# Patient Record
Sex: Female | Born: 1985 | Race: Black or African American | Hispanic: No | Marital: Single | Smoking: Never smoker
Health system: Southern US, Community
[De-identification: ages and names within clinical notes are randomized; demographics above are authoritative.]

## PROBLEM LIST (undated history)

## (undated) ENCOUNTER — Inpatient Hospital Stay (HOSPITAL_COMMUNITY): Payer: Self-pay

## (undated) DIAGNOSIS — E669 Obesity, unspecified: Secondary | ICD-10-CM

## (undated) DIAGNOSIS — IMO0002 Reserved for concepts with insufficient information to code with codable children: Secondary | ICD-10-CM

## (undated) DIAGNOSIS — O0930 Supervision of pregnancy with insufficient antenatal care, unspecified trimester: Secondary | ICD-10-CM

## (undated) DIAGNOSIS — D649 Anemia, unspecified: Secondary | ICD-10-CM

## (undated) DIAGNOSIS — R87629 Unspecified abnormal cytological findings in specimens from vagina: Secondary | ICD-10-CM

## (undated) HISTORY — DX: Anemia, unspecified: D64.9

## (undated) HISTORY — DX: Obesity, unspecified: E66.9

## (undated) HISTORY — DX: Reserved for concepts with insufficient information to code with codable children: IMO0002

## (undated) HISTORY — DX: Unspecified abnormal cytological findings in specimens from vagina: R87.629

## (undated) HISTORY — PX: NO PAST SURGERIES: SHX2092

## (undated) HISTORY — DX: Supervision of pregnancy with insufficient antenatal care, unspecified trimester: O09.30

---

## 2011-05-18 ENCOUNTER — Encounter: Payer: Self-pay | Admitting: Obstetrics and Gynecology

## 2011-10-08 ENCOUNTER — Emergency Department (HOSPITAL_COMMUNITY)
Admission: EM | Admit: 2011-10-08 | Discharge: 2011-10-08 | Disposition: A | Discharge status: Home / Self Care | Payer: Self-pay | Attending: Emergency Medicine | Admitting: Emergency Medicine

## 2011-10-08 ENCOUNTER — Emergency Department (HOSPITAL_COMMUNITY): Payer: Self-pay

## 2011-10-08 ENCOUNTER — Encounter (HOSPITAL_COMMUNITY): Payer: Self-pay | Admitting: *Deleted

## 2011-10-08 DIAGNOSIS — R1032 Left lower quadrant pain: Secondary | ICD-10-CM | POA: Insufficient documentation

## 2011-10-08 DIAGNOSIS — Z3201 Encounter for pregnancy test, result positive: Secondary | ICD-10-CM | POA: Insufficient documentation

## 2011-10-08 LAB — ABO/RH: ABO/RH(D): O POS

## 2011-10-08 LAB — URINALYSIS, ROUTINE W REFLEX MICROSCOPIC
Glucose, UA: NEGATIVE mg/dL
Leukocytes, UA: NEGATIVE
Protein, ur: NEGATIVE mg/dL
Specific Gravity, Urine: 1.024 (ref 1.005–1.030)
pH: 5.5 (ref 5.0–8.0)

## 2011-10-08 LAB — POCT PREGNANCY, URINE: Preg Test, Ur: POSITIVE — AB

## 2011-10-08 LAB — CBC
MCH: 24.1 pg — ABNORMAL LOW (ref 26.0–34.0)
Platelets: 247 10*3/uL (ref 150–400)
RBC: 4.65 MIL/uL (ref 3.87–5.11)

## 2011-10-08 LAB — WET PREP, GENITAL

## 2011-10-08 MED ORDER — OXYCODONE-ACETAMINOPHEN 5-325 MG PO TABS
1.0000 | ORAL_TABLET | ORAL | Status: AC
  Administered 2011-10-08: 1 via ORAL
  Filled 2011-10-08: qty 1

## 2011-10-08 MED ORDER — ACETAMINOPHEN 325 MG PO TABS
650.0000 mg | ORAL_TABLET | Freq: Once | ORAL | Status: AC
  Administered 2011-10-08: 650 mg via ORAL
  Filled 2011-10-08: qty 2

## 2011-10-08 NOTE — ED Provider Notes (Signed)
History     CSN: [id number]  Arrival date & time 10/08/11  1739   None     Chief Complaint  Patient presents with  . Abdominal Pain    (Consider location/radiation/quality/duration/timing/severity/associated sxs/prior treatment) Patient is a 26 y.o. female presenting with abdominal pain. The history is provided by the patient.  Abdominal Pain The primary symptoms of the illness include abdominal pain. The primary symptoms of the illness do not include fever, fatigue, shortness of breath, nausea, vomiting, diarrhea or dysuria. The current episode started yesterday (11pm). The onset of the illness was sudden. The problem has not changed since onset. The abdominal pain began yesterday. The pain came on suddenly. The abdominal pain has been unchanged since its onset. The abdominal pain is located in the LLQ. The abdominal pain does not radiate. The severity of the abdominal pain is 7/10. The abdominal pain is relieved by nothing. Exacerbated by: palpation.  Associated with: possibly assoc w/ preg. The patient states that she believes she is currently pregnant. The patient has not had a change in bowel habit. Symptoms associated with the illness do not include hematuria or back pain.    History reviewed. No pertinent past medical history.  History reviewed. No pertinent past surgical history.  No family history on file.  History  Substance Use Topics  . Smoking status: Never Smoker   . Smokeless tobacco: Not on file  . Alcohol Use: No    OB History    Grav Para Term Preterm Abortions TAB SAB Ect Mult Living                  Review of Systems  Constitutional: Negative for fever and fatigue.  HENT: Negative for congestion, drooling and neck pain.   Eyes: Negative for pain.  Respiratory: Negative for cough and shortness of breath.   Cardiovascular: Negative for chest pain.  Gastrointestinal: Positive for abdominal pain. Negative for nausea, vomiting and diarrhea.    Genitourinary: Negative for dysuria and hematuria.  Musculoskeletal: Negative for back pain and gait problem.  Skin: Negative for color change.  Neurological: Negative for dizziness and headaches.  Hematological: Negative for adenopathy.  Psychiatric/Behavioral: Negative for behavioral problems.  All other systems reviewed and are negative.    Allergies  Review of patient's allergies indicates no known allergies.  Home Medications  No current outpatient prescriptions on file.  BP 127/74  Pulse 69  Temp 98.4 F (36.9 C) (Oral)  Resp 16  Ht 5\' 9"  (1.753 m)  Wt 215 lb (97.523 kg)  BMI 31.75 kg/m2  SpO2 100%  LMP 08/30/2011  Physical Exam  Nursing note and vitals reviewed. Constitutional: She is oriented to person, place, and time. She appears well-developed and well-nourished.  HENT:  Head: Normocephalic.  Mouth/Throat: No oropharyngeal exudate.  Eyes: Conjunctivae normal and EOM are normal. Pupils are equal, round, and reactive to light.  Neck: Normal range of motion. Neck supple.  Cardiovascular: Normal rate, regular rhythm, normal heart sounds and intact distal pulses.  Exam reveals no gallop and no friction rub.   No murmur heard. Pulmonary/Chest: Effort normal and breath sounds normal. No respiratory distress. She has no wheezes.  Abdominal: Soft. Bowel sounds are normal. There is tenderness (mild ttp of LLQ). There is no rebound and no guarding.  Genitourinary: Vagina normal. No vaginal discharge found.       Normal appearing external vagina. Normal appearing cervix, os closed, small amount of white fluid in posterior fornix. No CMT or ttp on  bimanual.   Musculoskeletal: Normal range of motion. She exhibits no edema and no tenderness.  Neurological: She is alert and oriented to person, place, and time.  Skin: Skin is warm and dry.  Psychiatric: She has a normal mood and affect. Her behavior is normal.    ED Course  Procedures (including critical care time)  Labs  Reviewed  POCT PREGNANCY, URINE - Abnormal; Notable for the following:    Preg Test, Ur POSITIVE (*)     All other components within normal limits  HCG, QUANTITATIVE, PREGNANCY - Abnormal; Notable for the following:    hCG, Beta Chain, Quant, S 2248 (*)     All other components within normal limits  CBC - Abnormal; Notable for the following:    WBC 14.1 (*)     Hemoglobin 11.2 (*)     HCT 34.5 (*)     MCV 74.2 (*)     MCH 24.1 (*)     All other components within normal limits  WET PREP, GENITAL - Abnormal; Notable for the following:    WBC, Wet Prep HPF POC FEW (*)     All other components within normal limits  URINALYSIS, ROUTINE W REFLEX MICROSCOPIC  ABO/RH  GC/CHLAMYDIA PROBE AMP, GENITAL  GC/CHLAMYDIA PROBE AMP, URINE  GC/CHLAMYDIA PROBE AMP, GENITAL   US Ob Comp Less 14 Wks  10/08/2011  *RADIOLOGY REPORT*  Clinical Data: 26 year old female with left lower quadrant pain and positive pregnancy test.  Quantitative beta HCG not specified. Gestational age by LMP 5 weeks and 5 days.  OBSTETRIC <14 WK [place] AND TRANSVAGINAL OB US  Technique:  Both transabdominal and transvaginal ultrasound examinations were performed for complete evaluation of the gestation as well as the maternal uterus, adnexal regions, and pelvic cul-de-sac.  Transvaginal technique was performed to assess early pregnancy.  Comparison:  None.  Intrauterine gestational sac:  Single probable early gestational sac in the fundus Yolk sac: None Embryo: None Cardiac Activity: None  MSD: 4.7 mm  5 w 2 d [place] EDC: 06/07/2012  Maternal uterus/adnexae: Trace simple appearing free fluid in the cul-de-sac.  No subchorionic hemorrhage.  Normal right ovary measuring 3.2 x 2.4 x 3.0 cm.  Normal left ovary measuring 2.8 x 1.8 x 1.9 cm.  Nabothian cyst in the cervix.  IMPRESSION: Probable early gestational sac in the uterus.  No yolk sac or embryo visible.  No adnexal mass.  Trace simple appearing free fluid. Recommend correlation with serial  quantitative BHCG and followup imaging.   Original Report Authenticated By: Harley Hallmark, M.D.    US Ob Transvaginal  10/08/2011  *RADIOLOGY REPORT*  Clinical Data: 26 year old female with left lower quadrant pain and positive pregnancy test.  Quantitative beta HCG not specified. Gestational age by LMP 5 weeks and 5 days.  OBSTETRIC <14 WK [place] AND TRANSVAGINAL OB US  Technique:  Both transabdominal and transvaginal ultrasound examinations were performed for complete evaluation of the gestation as well as the maternal uterus, adnexal regions, and pelvic cul-de-sac.  Transvaginal technique was performed to assess early pregnancy.  Comparison:  None.  Intrauterine gestational sac:  Single probable early gestational sac in the fundus Yolk sac: None Embryo: None Cardiac Activity: None  MSD: 4.7 mm  5 w 2 d [place] EDC: 06/07/2012  Maternal uterus/adnexae: Trace simple appearing free fluid in the cul-de-sac.  No subchorionic hemorrhage.  Normal right ovary measuring 3.2 x 2.4 x 3.0 cm.  Normal left ovary measuring 2.8 x 1.8 x 1.9 cm.  Nabothian cyst in the cervix.  IMPRESSION: Probable early gestational sac in the uterus.  No yolk sac or embryo visible.  No adnexal mass.  Trace simple appearing free fluid. Recommend correlation with serial quantitative BHCG and followup imaging.   Original Report Authenticated By: Harley Hallmark, M.D.      1. LLQ abdominal pain       MDM  2:25 PM 26 y.o. female G58P4 s/p 2 elective abortions and 2 living children pw LLQ pain that started yesterday evening. Pt believes LMP to be 13th of Aug '13. She did have spotting on the 13th of this month and has intermittent cramps in lower abd for last week w/ sudden onset of LLQ sharp pain last night that has persisted. Pt AFVSS here, LLQ pain on exam, abd soft and benign. Upreg pos. Will perform pelvic, labs, [place]. Tylenol for pain.     Intrauterine gestational sac noted on [place] and is reassuring. Spoke w/ Dr. Marcelle Overlie who is on call for Dr.  [facility]. Will have her f/u in 48 hrs. Pt notified to call for appt tomorrow.  I have discussed the diagnosis/risks/treatment options with the patient and believe the pt to be eligible for discharge home to follow-up with Dr. Chevis Pretty in 2 days, call for appt tomorrow. We also discussed returning to the ED immediately if new or worsening sx occur. We discussed the sx which are most concerning (e.g., worsening pain, vaginal bleeding) that necessitate immediate return. Any new prescriptions provided to the patient are listed below.  New Prescriptions   No medications on file   Clinical Impression 1. LLQ abdominal pain             Purvis Sheffield, MD 10/09/11 1425

## 2011-10-08 NOTE — ED Notes (Signed)
The patient is AOx4 and comfortable with her discharge instructions. 

## 2011-10-08 NOTE — ED Notes (Signed)
To ED for eval of LLQ pain and spotting since last night. Pt is [redacted] wks pregnant.

## 2011-10-09 LAB — GC/CHLAMYDIA PROBE AMP, GENITAL: GC Probe Amp, Genital: NEGATIVE

## 2011-10-09 NOTE — ED Provider Notes (Signed)
I saw and evaluated the patient, reviewed the resident's note and I agree with the findings and plan. lmp approx. 1 mo ago. C/o spotting and left sided abd pain. No uti sxs. No n/v/f/c.  No light headedness or sob.  pe mild llq ttp. No peritoneal signs.  gu exam by resident.   Quant > 2000.  [place] + gest sac.  Will have pt f/u with her ob for repeat quant and monitoring.  Cheri Guppy, MD 10/09/11 [phone]

## 2011-10-11 LAB — GC/CHLAMYDIA PROBE AMP, URINE: Chlamydia, Swab/Urine, PCR: NEGATIVE

## 2011-10-14 NOTE — ED Provider Notes (Signed)
I saw and evaluated the patient, reviewed the resident's note and I agree with the findings and plan.  Taiden Raybourn, MD 10/14/11 0715 

## 2012-01-17 NOTE — L&D Delivery Note (Signed)
Delivery Note At 2:13 PM a viable female was delivered via  (Presentation: ; Occiput Anterior).  APGAR: 9, 9; weight pending.   Placenta status: intact, spontaneous.  Cord: 3 vessels with the following complications: None.  Cord pH: n/a  Anesthesia:  Epidural Episiotomy: none Lacerations: minor right labial Suture Repair: n/a Est. Blood Loss (mL): 250 ml  Mom to postpartum.  Baby to nursery-stable.  Napoleon Form 06/11/2012, 2:30 PM

## 2012-04-24 ENCOUNTER — Other Ambulatory Visit (HOSPITAL_COMMUNITY): Payer: Self-pay | Admitting: Physician Assistant

## 2012-04-24 DIAGNOSIS — Z3689 Encounter for other specified antenatal screening: Secondary | ICD-10-CM

## 2012-04-24 LAB — OB RESULTS CONSOLE ANTIBODY SCREEN: Antibody Screen: NEGATIVE

## 2012-04-24 LAB — OB RESULTS CONSOLE HIV ANTIBODY (ROUTINE TESTING): HIV: NONREACTIVE

## 2012-04-24 LAB — OB RESULTS CONSOLE GC/CHLAMYDIA: Gonorrhea: NEGATIVE

## 2012-04-24 LAB — OB RESULTS CONSOLE ABO/RH

## 2012-04-29 ENCOUNTER — Ambulatory Visit (HOSPITAL_COMMUNITY)
Admission: RE | Admit: 2012-04-29 | Discharge: 2012-04-29 | Disposition: A | Discharge status: Home / Self Care | Payer: Medicaid Other | Source: Ambulatory Visit | Attending: Physician Assistant | Admitting: Physician Assistant

## 2012-04-29 DIAGNOSIS — Z363 Encounter for antenatal screening for malformations: Secondary | ICD-10-CM | POA: Insufficient documentation

## 2012-04-29 DIAGNOSIS — Z1389 Encounter for screening for other disorder: Secondary | ICD-10-CM | POA: Insufficient documentation

## 2012-04-29 DIAGNOSIS — O0930 Supervision of pregnancy with insufficient antenatal care, unspecified trimester: Secondary | ICD-10-CM | POA: Insufficient documentation

## 2012-04-29 DIAGNOSIS — Z3689 Encounter for other specified antenatal screening: Secondary | ICD-10-CM

## 2012-04-29 DIAGNOSIS — O358XX0 Maternal care for other (suspected) fetal abnormality and damage, not applicable or unspecified: Secondary | ICD-10-CM | POA: Insufficient documentation

## 2012-05-08 LAB — OB RESULTS CONSOLE GBS: GBS: NEGATIVE

## 2012-06-04 ENCOUNTER — Encounter (HOSPITAL_COMMUNITY): Payer: Self-pay | Admitting: *Deleted

## 2012-06-04 ENCOUNTER — Telehealth (HOSPITAL_COMMUNITY): Payer: Self-pay | Admitting: *Deleted

## 2012-06-04 NOTE — Telephone Encounter (Signed)
Preadmission screen  

## 2012-06-07 ENCOUNTER — Ambulatory Visit (INDEPENDENT_AMBULATORY_CARE_PROVIDER_SITE_OTHER): Payer: Medicaid Other | Admitting: *Deleted

## 2012-06-07 VITALS — BP 119/78 | Wt 272.1 lb

## 2012-06-07 DIAGNOSIS — O480 Post-term pregnancy: Secondary | ICD-10-CM

## 2012-06-07 NOTE — Progress Notes (Signed)
P = 86   Results reported to Dr. Jolayne Panther and she reviewed tracing prior to pt discharge.  Pt is scheduled for IOL on 5/27.

## 2012-06-07 NOTE — Progress Notes (Signed)
NST reviewed and reactive. AFI 14 and BPP 8/8. Patient scheduled for IOL tuesday

## 2012-06-11 ENCOUNTER — Encounter (HOSPITAL_COMMUNITY): Payer: Self-pay

## 2012-06-11 ENCOUNTER — Encounter (HOSPITAL_COMMUNITY): Payer: Self-pay | Admitting: Anesthesiology

## 2012-06-11 ENCOUNTER — Inpatient Hospital Stay (HOSPITAL_COMMUNITY): Payer: Medicaid Other | Admitting: Anesthesiology

## 2012-06-11 ENCOUNTER — Inpatient Hospital Stay (HOSPITAL_COMMUNITY)
Admission: RE | Admit: 2012-06-11 | Discharge: 2012-06-12 | DRG: 775 | Disposition: A | Discharge status: Home / Self Care | Payer: Medicaid Other | Source: Ambulatory Visit | Attending: Family Medicine | Admitting: Family Medicine

## 2012-06-11 DIAGNOSIS — O480 Post-term pregnancy: Principal | ICD-10-CM | POA: Diagnosis present

## 2012-06-11 LAB — TYPE AND SCREEN
ABO/RH(D): O POS
Antibody Screen: NEGATIVE

## 2012-06-11 LAB — CBC
HCT: 29.7 % — ABNORMAL LOW (ref 36.0–46.0)
MCHC: 31 g/dL (ref 30.0–36.0)
Platelets: 223 10*3/uL (ref 150–400)
RDW: 16.1 % — ABNORMAL HIGH (ref 11.5–15.5)
WBC: 14.4 10*3/uL — ABNORMAL HIGH (ref 4.0–10.5)

## 2012-06-11 MED ORDER — WITCH HAZEL-GLYCERIN EX PADS: 1.0000 "application " | MEDICATED_PAD | CUTANEOUS | Status: DC | PRN

## 2012-06-11 MED ORDER — ONDANSETRON HCL 4 MG PO TABS: 4.0000 mg | ORAL_TABLET | ORAL | Status: DC | PRN

## 2012-06-11 MED ORDER — OXYTOCIN BOLUS FROM INFUSION: 500.0000 mL | INTRAVENOUS | Status: DC

## 2012-06-11 MED ORDER — OXYCODONE-ACETAMINOPHEN 5-325 MG PO TABS: 1.0000 | ORAL_TABLET | ORAL | Status: DC | PRN

## 2012-06-11 MED ORDER — SENNOSIDES-DOCUSATE SODIUM 8.6-50 MG PO TABS
2.0000 | ORAL_TABLET | Freq: Every day | ORAL | Status: DC
  Administered 2012-06-11: 2 via ORAL

## 2012-06-11 MED ORDER — EPHEDRINE 5 MG/ML INJ
10.0000 mg | INTRAVENOUS | Status: DC | PRN
  Filled 2012-06-11: qty 2

## 2012-06-11 MED ORDER — FENTANYL 2.5 MCG/ML BUPIVACAINE 1/10 % EPIDURAL INFUSION (WH - ANES)
14.0000 mL/h | INTRAMUSCULAR | Status: DC | PRN
  Administered 2012-06-11: 14 mL/h via EPIDURAL
  Filled 2012-06-11: qty 125

## 2012-06-11 MED ORDER — TERBUTALINE SULFATE 1 MG/ML IJ SOLN: 0.2500 mg | Freq: Once | INTRAMUSCULAR | Status: DC | PRN

## 2012-06-11 MED ORDER — SIMETHICONE 80 MG PO CHEW: 80.0000 mg | CHEWABLE_TABLET | ORAL | Status: DC | PRN

## 2012-06-11 MED ORDER — BISACODYL 10 MG RE SUPP: 10.0000 mg | Freq: Every day | RECTAL | Status: DC | PRN

## 2012-06-11 MED ORDER — PRENATAL MULTIVITAMIN CH
1.0000 | ORAL_TABLET | Freq: Every day | ORAL | Status: DC
  Administered 2012-06-12: 1 via ORAL
  Filled 2012-06-11: qty 1

## 2012-06-11 MED ORDER — LACTATED RINGERS IV SOLN
500.0000 mL | Freq: Once | INTRAVENOUS | Status: AC
  Administered 2012-06-11: 500 mL via INTRAVENOUS

## 2012-06-11 MED ORDER — EPHEDRINE 5 MG/ML INJ
10.0000 mg | INTRAVENOUS | Status: DC | PRN
  Filled 2012-06-11: qty 2
  Filled 2012-06-11: qty 4

## 2012-06-11 MED ORDER — IBUPROFEN 600 MG PO TABS
600.0000 mg | ORAL_TABLET | Freq: Four times a day (QID) | ORAL | Status: DC | PRN
  Filled 2012-06-11: qty 1

## 2012-06-11 MED ORDER — LIDOCAINE HCL (PF) 1 % IJ SOLN
INTRAMUSCULAR | Status: DC | PRN
  Administered 2012-06-11 (×4): 4 mL

## 2012-06-11 MED ORDER — TETANUS-DIPHTH-ACELL PERTUSSIS 5-2.5-18.5 LF-MCG/0.5 IM SUSP
0.5000 mL | Freq: Once | INTRAMUSCULAR | Status: AC
  Administered 2012-06-12: 0.5 mL via INTRAMUSCULAR
  Filled 2012-06-11: qty 0.5

## 2012-06-11 MED ORDER — NALBUPHINE SYRINGE 5 MG/0.5 ML
10.0000 mg | INJECTION | INTRAMUSCULAR | Status: DC | PRN
  Filled 2012-06-11: qty 1

## 2012-06-11 MED ORDER — LACTATED RINGERS IV SOLN
INTRAVENOUS | Status: DC
  Administered 2012-06-11 (×2): via INTRAVENOUS

## 2012-06-11 MED ORDER — DIPHENHYDRAMINE HCL 25 MG PO CAPS: 25.0000 mg | ORAL_CAPSULE | Freq: Four times a day (QID) | ORAL | Status: DC | PRN

## 2012-06-11 MED ORDER — PHENYLEPHRINE 40 MCG/ML (10ML) SYRINGE FOR IV PUSH (FOR BLOOD PRESSURE SUPPORT)
80.0000 ug | PREFILLED_SYRINGE | INTRAVENOUS | Status: DC | PRN
  Filled 2012-06-11: qty 2

## 2012-06-11 MED ORDER — OXYTOCIN 40 UNITS IN LACTATED RINGERS INFUSION - SIMPLE MED: 62.5000 mL/h | INTRAVENOUS | Status: DC

## 2012-06-11 MED ORDER — FLEET ENEMA 7-19 GM/118ML RE ENEM: 1.0000 | ENEMA | Freq: Every day | RECTAL | Status: DC | PRN

## 2012-06-11 MED ORDER — LANOLIN HYDROUS EX OINT: TOPICAL_OINTMENT | CUTANEOUS | Status: DC | PRN

## 2012-06-11 MED ORDER — LIDOCAINE HCL (PF) 1 % IJ SOLN
30.0000 mL | INTRAMUSCULAR | Status: DC | PRN
  Filled 2012-06-11: qty 30

## 2012-06-11 MED ORDER — OXYTOCIN 40 UNITS IN LACTATED RINGERS INFUSION - SIMPLE MED: 62.5000 mL/h | INTRAVENOUS | Status: DC | PRN

## 2012-06-11 MED ORDER — BENZOCAINE-MENTHOL 20-0.5 % EX AERO
1.0000 "application " | INHALATION_SPRAY | CUTANEOUS | Status: DC | PRN
  Administered 2012-06-11: 1 via TOPICAL
  Filled 2012-06-11: qty 56

## 2012-06-11 MED ORDER — ZOLPIDEM TARTRATE 5 MG PO TABS: 5.0000 mg | ORAL_TABLET | Freq: Every evening | ORAL | Status: DC | PRN

## 2012-06-11 MED ORDER — FERROUS SULFATE 325 (65 FE) MG PO TABS
325.0000 mg | ORAL_TABLET | Freq: Two times a day (BID) | ORAL | Status: DC
  Administered 2012-06-11 – 2012-06-12 (×2): 325 mg via ORAL
  Filled 2012-06-11 (×2): qty 1

## 2012-06-11 MED ORDER — ONDANSETRON HCL 4 MG/2ML IJ SOLN: 4.0000 mg | Freq: Four times a day (QID) | INTRAMUSCULAR | Status: DC | PRN

## 2012-06-11 MED ORDER — DIBUCAINE 1 % RE OINT: 1.0000 "application " | TOPICAL_OINTMENT | RECTAL | Status: DC | PRN

## 2012-06-11 MED ORDER — OXYTOCIN 40 UNITS IN LACTATED RINGERS INFUSION - SIMPLE MED
1.0000 m[IU]/min | INTRAVENOUS | Status: DC
  Administered 2012-06-11: 2 m[IU]/min via INTRAVENOUS
  Filled 2012-06-11: qty 1000

## 2012-06-11 MED ORDER — PHENYLEPHRINE 40 MCG/ML (10ML) SYRINGE FOR IV PUSH (FOR BLOOD PRESSURE SUPPORT)
80.0000 ug | PREFILLED_SYRINGE | INTRAVENOUS | Status: DC | PRN
  Filled 2012-06-11: qty 5
  Filled 2012-06-11: qty 2

## 2012-06-11 MED ORDER — IBUPROFEN 600 MG PO TABS
600.0000 mg | ORAL_TABLET | Freq: Four times a day (QID) | ORAL | Status: DC
  Administered 2012-06-11 – 2012-06-12 (×4): 600 mg via ORAL
  Filled 2012-06-11 (×3): qty 1

## 2012-06-11 MED ORDER — ACETAMINOPHEN 325 MG PO TABS: 650.0000 mg | ORAL_TABLET | ORAL | Status: DC | PRN

## 2012-06-11 MED ORDER — ONDANSETRON HCL 4 MG/2ML IJ SOLN: 4.0000 mg | INTRAMUSCULAR | Status: DC | PRN

## 2012-06-11 MED ORDER — DIPHENHYDRAMINE HCL 50 MG/ML IJ SOLN: 12.5000 mg | INTRAMUSCULAR | Status: DC | PRN

## 2012-06-11 MED ORDER — CITRIC ACID-SODIUM CITRATE 334-500 MG/5ML PO SOLN: 30.0000 mL | ORAL | Status: DC | PRN

## 2012-06-11 MED ORDER — LACTATED RINGERS IV SOLN: 500.0000 mL | INTRAVENOUS | Status: DC | PRN

## 2012-06-11 MED ORDER — MEASLES, MUMPS & RUBELLA VAC ~~LOC~~ INJ
0.5000 mL | INJECTION | Freq: Once | SUBCUTANEOUS | Status: AC
  Administered 2012-06-12: 0.5 mL via SUBCUTANEOUS
  Filled 2012-06-11: qty 0.5

## 2012-06-11 NOTE — H&P (Signed)
Morgan Schwartz is a 27 y.o. female [phone] [redacted]w[redacted]d presenting for IOL for post-dates.  She [facility] 1-week ago.  She has had no complications.  Patient states she has been having regular contractions with no vaginal bleeding.  Membranes are intact with active fetal movement.   She has had 2 vaginal deliveries without complications and 2 TABs.  Maternal Medical History:  Reason for admission: Contractions.  Nausea.  Contractions: Onset was more than 2 days ago.   Frequency: irregular.   Duration is approximately 30 seconds.   Perceived severity is mild.    Fetal activity: Perceived fetal activity is normal.   Last perceived fetal movement was within the past hour.      OB History   Grav Para Term Preterm Abortions TAB SAB Ect Mult Living   6 2 2  2 2    2      Past Medical History  Diagnosis Date  . Abnormal Pap smear   . Anemia   . Obesity   . Late prenatal care    Past Surgical History  Procedure Laterality Date  . No past surgeries     Family History: family history is not on file. Social History:  reports that she has never smoked. She has never used smokeless tobacco. She reports that she does not drink alcohol or use illicit drugs.   Prenatal Transfer Tool  Maternal Diabetes: No Genetic Screening: Declined Maternal Ultrasounds/Referrals: Normal Fetal Ultrasounds or other Referrals:  None Maternal Substance Abuse:  No Significant Maternal Medications:  None Significant Maternal Lab Results:  None Other Comments:  None  Review of Systems  Constitutional: Negative for fever and chills.  HENT: Negative for ear pain and tinnitus.   Eyes: Negative for blurred vision, double vision and photophobia.  Respiratory: Negative for cough, hemoptysis and sputum production.   Cardiovascular: Negative for chest pain, palpitations and claudication.  Gastrointestinal: Positive for abdominal pain. Negative  for heartburn, nausea, vomiting, diarrhea and constipation.       Cramping/contractions  Genitourinary: Negative for dysuria, hematuria and flank pain.  Neurological: Negative for dizziness, weakness and headaches.  Endo/Heme/Allergies: Does not bruise/bleed easily.      Blood pressure 133/88, pulse 88, temperature 98.2 F (36.8 C), temperature source Oral, resp. rate 20, height 5\' 8"  (1.727 m), weight 123.378 kg (272 lb), last menstrual period 08/30/2011, unknown if currently breastfeeding. Maternal Exam:  Uterine Assessment: Contraction strength is firm.  Contraction frequency is irregular.   Abdomen: Fetal presentation: vertex  Introitus: Normal vulva. Normal vagina.  Pelvis: adequate for delivery.   Cervix: Cervix evaluated by digital exam.     Fetal Exam Fetal Monitor Review: Mode: ultrasound.    Fetal State Assessment: Category I - tracings are normal.     Physical Exam  Constitutional: She is oriented to person, place, and time. She appears well-developed and well-nourished. No distress.  HENT:  Head: Normocephalic.  Eyes: Pupils are equal, round, and reactive to light.  Cardiovascular: Normal rate and regular rhythm.  Exam reveals no gallop and no friction rub.   No murmur heard. Respiratory: Effort normal and breath sounds normal. No respiratory distress. She has no wheezes. She has no rales.  GI: Soft. Bowel sounds are normal. There is no tenderness.  Gravid  Genitourinary:  3-4/50%/-2  Neurological: She is alert and oriented to person, place, and time.    Prenatal labs: ABO, Rh: O/Positive/-- (04/09 0000) Antibody: Negative (  04/09 0000) Rubella: Immune (04/09 0000) RPR: Nonreactive (04/09 0000)  HBsAg: Negative (04/09 0000)  HIV: Non-reactive (04/09 0000)  GBS: Negative (04/23 0000)   Assessment/Plan: 1) Admit to L&D 2) GBS neg, no prophylaxis 3) Epidural when desired 4) Augment with Pitocin if needed 5) Anticipate SVD, fetal heart tones  reactive   Anna Genre 06/11/2012, 9:05 AM  I saw and examined patient and agree with above student note. I reviewed history, imaging, labs, and vitals. I personally reviewed the fetal heart tracing, and it is reactive. Napoleon Form, MD

## 2012-06-11 NOTE — Anesthesia Procedure Notes (Signed)
Epidural Patient location during procedure: OB Start time: 06/11/2012 1:20 PM  Staffing Performed by: anesthesiologist   Preanesthetic Checklist Completed: patient identified, site marked, surgical consent, pre-op evaluation, timeout performed, IV checked, risks and benefits discussed and monitors and equipment checked  Epidural Patient position: sitting Prep: site prepped and draped and DuraPrep Patient monitoring: continuous pulse ox and blood pressure Approach: midline Injection technique: LOR air  Needle:  Needle type: Tuohy  Needle gauge: 17 G Needle length: 9 cm and 9 Needle insertion depth: 7.5 cm Catheter type: closed end flexible Catheter size: 19 Gauge Catheter at skin depth: 12.5 cm Test dose: negative  Assessment Events: blood not aspirated, injection not painful, no injection resistance, negative IV test and no paresthesia  Additional Notes Discussed risk of headache, infection, bleeding, nerve injury and failed or incomplete block.  Patient voices understanding and wishes to proceed.  Epidural placed easily on first attempt.  No paresthesia.  Patient tolerated procedure well with no apparent complications.  Jasmine December, MD Reason for block:procedure for pain

## 2012-06-11 NOTE — Anesthesia Preprocedure Evaluation (Signed)
Anesthesia Evaluation  Patient identified by MRN, date of birth, ID band Patient awake    Reviewed: Allergy & Precautions, H&P , NPO status , Patient's Chart, lab work & pertinent test results, reviewed documented beta blocker date and time   History of Anesthesia Complications Negative for: history of anesthetic complications  Airway Mallampati: III TM Distance: >3 FB Neck ROM: full    Dental  (+) Teeth Intact   Pulmonary neg pulmonary ROS,  breath sounds clear to auscultation        Cardiovascular negative cardio ROS  Rhythm:regular Rate:Normal     Neuro/Psych negative neurological ROS  negative psych ROS   GI/Hepatic negative GI ROS, Neg liver ROS,   Endo/Other  Morbid obesity  Renal/GU negative Renal ROS     Musculoskeletal   Abdominal   Peds  Hematology  (+) anemia ,   Anesthesia Other Findings   Reproductive/Obstetrics (+) Pregnancy                          Anesthesia Physical Anesthesia Plan  ASA: III  Anesthesia Plan: Epidural   Post-op Pain Management:    Induction:   Airway Management Planned:   Additional Equipment:   Intra-op Plan:   Post-operative Plan:   Informed Consent: I have reviewed the patients History and Physical, chart, labs and discussed the procedure including the risks, benefits and alternatives for the proposed anesthesia with the patient or authorized representative who has indicated his/her understanding and acceptance.     Plan Discussed with:   Anesthesia Plan Comments:        Anesthesia Quick Evaluation  

## 2012-06-12 MED ORDER — IBUPROFEN 600 MG PO TABS: 600.0000 mg | ORAL_TABLET | Freq: Four times a day (QID) | ORAL | Status: DC

## 2012-06-12 NOTE — Anesthesia Postprocedure Evaluation (Signed)
Anesthesia Post Note  Patient: Management consultant  Procedure(s) Performed: * No procedures listed *  Anesthesia type: Epidural  Patient location: Mother/Baby  Post pain: Pain level controlled  Post assessment: Post-op Vital signs reviewed  Last Vitals:  Filed Vitals:   06/12/12 0506  BP: 118/81  Pulse: 68  Temp: 36.9 C  Resp: 17    Post vital signs: Reviewed  Level of consciousness:alert  Complications: No apparent anesthesia complications

## 2012-06-12 NOTE — Progress Notes (Signed)
Ur chart review completed.  

## 2012-06-12 NOTE — Discharge Summary (Signed)
Attestation of Attending Supervision of Advanced Practitioner (CNM/NP): Evaluation and management procedures were performed by the Advanced Practitioner under my supervision and collaboration.  I have reviewed the Advanced Practitioner's note and chart, and I agree with the management and plan.  HARRAWAY-SMITH, Dragan Tamburrino 11:50 AM

## 2012-06-12 NOTE — Discharge Summary (Signed)
Obstetric Discharge Summary Reason for Admission: induction of labor Prenatal Procedures: none Intrapartum Procedures: spontaneous vaginal delivery Postpartum Procedures: none Complications-Operative and Postpartum: none  Morgan Schwartz is a 27 y.o. female [phone] who had a SVD yesterday at 1400.  She originally presented to the [facility] for for IOL for post-dates.  She states that she has been voiding and passing flatus without complication, though she has yet to have a bowel movement. She states her pain is controlled with ibuprofen and she has no dizziness upon standing. She will breast feed and plans to [facility] an [facility] to [facility]. She is not planning a circumcision for her son.     Hemoglobin  Date Value Range Status  06/11/2012 9.2* 12.0 - 15.0 g/dL Final     HCT  Date Value Range Status  06/11/2012 29.7* 36.0 - 46.0 % Final    Physical Exam:  General: alert, cooperative and no distress CVS: RRR, good S1 and S2, no RGM  Lungs: CTA bilaterally, no wheeze, rales, or rhonchi  Abd: Normoactive BS x4, non-distended; no tenderness on palpation Lochia: appropriate Uterine Fundus: firm DVT Evaluation: No evidence of DVT seen on physical exam.  Negative Homan's sign.  Discharge Diagnoses: Term Pregnancy-delivered  Discharge Information: Date: 06/12/2012 Activity: pelvic rest Diet: routine Medications: PNV, Ibuprofen, Colace and Iron Condition: stable Instructions: refer to practice specific booklet Discharge to: home   Newborn Data: Live born female  Birth Weight: 7 lb 7 oz (3374 g) APGAR: 9, 9  Home with mother.  Anna Genre 06/12/2012, 8:08 AM  I examined pt and agree with documentation above and PA-S plan of care. [facility]

## 2012-07-04 ENCOUNTER — Encounter: Payer: Self-pay | Admitting: *Deleted

## 2012-07-23 ENCOUNTER — Encounter (HOSPITAL_COMMUNITY): Payer: Self-pay | Admitting: *Deleted

## 2013-03-21 ENCOUNTER — Emergency Department (HOSPITAL_COMMUNITY)
Admission: EM | Admit: 2013-03-21 | Discharge: 2013-03-21 | Disposition: A | Discharge status: Home / Self Care | Payer: Medicaid Other | Attending: Emergency Medicine | Admitting: Emergency Medicine

## 2013-03-21 ENCOUNTER — Encounter (HOSPITAL_COMMUNITY): Payer: Self-pay | Admitting: Emergency Medicine

## 2013-03-21 DIAGNOSIS — R21 Rash and other nonspecific skin eruption: Secondary | ICD-10-CM | POA: Insufficient documentation

## 2013-03-21 DIAGNOSIS — T7840XA Allergy, unspecified, initial encounter: Secondary | ICD-10-CM

## 2013-03-21 DIAGNOSIS — E669 Obesity, unspecified: Secondary | ICD-10-CM | POA: Insufficient documentation

## 2013-03-21 DIAGNOSIS — Z862 Personal history of diseases of the blood and blood-forming organs and certain disorders involving the immune mechanism: Secondary | ICD-10-CM | POA: Insufficient documentation

## 2013-03-21 DIAGNOSIS — T148XXA Other injury of unspecified body region, initial encounter: Secondary | ICD-10-CM

## 2013-03-21 DIAGNOSIS — L089 Local infection of the skin and subcutaneous tissue, unspecified: Secondary | ICD-10-CM

## 2013-03-21 MED ORDER — DOXYCYCLINE HYCLATE 100 MG PO CAPS: 100.0000 mg | ORAL_CAPSULE | Freq: Two times a day (BID) | ORAL | Status: DC

## 2013-03-21 MED ORDER — PREDNISONE 20 MG PO TABS: ORAL_TABLET | ORAL | Status: DC

## 2013-03-21 MED ORDER — DIPHENHYDRAMINE HCL 25 MG PO TABS: 25.0000 mg | ORAL_TABLET | Freq: Four times a day (QID) | ORAL | Status: DC

## 2013-03-21 MED ORDER — FAMOTIDINE 20 MG PO TABS: 20.0000 mg | ORAL_TABLET | Freq: Two times a day (BID) | ORAL | Status: DC

## 2013-03-21 NOTE — Discharge Instructions (Signed)
Read the information below.  Use the prescribed medication as directed.  Please discuss all new medications with your pharmacist.  You may return to the Emergency Department at any time for worsening condition or any new symptoms that concern you.  If there is any possibility that you might be pregnant, please let your health care provider know and discuss this with the pharmacist to ensure medication safety.  If you develop redness, swelling, pus draining from the wound, are unable to move your finger, have red streaks going up your hand or arm, or fevers greater than 100.4, return to the ER immediately for a recheck.  If you develop difficulty swallowing or breathing, or you are unable to tolerate fluids by mouth, return to the ER immediately for a recheck.     Contact Dermatitis Contact dermatitis is a reaction to certain substances that touch the skin. Contact dermatitis can be either irritant contact dermatitis or allergic contact dermatitis. Irritant contact dermatitis does not require previous exposure to the substance for a reaction to occur.Allergic contact dermatitis only occurs if you have been exposed to the substance before. Upon a repeat exposure, your body reacts to the substance.  CAUSES  Many substances can cause contact dermatitis. Irritant dermatitis is most commonly caused by repeated exposure to mildly irritating substances, such as:  Makeup.  Soaps.  Detergents.  Bleaches.  Acids.  Metal salts, such as nickel. Allergic contact dermatitis is most commonly caused by exposure to:  Poisonous plants.  Chemicals (deodorants, shampoos).  Jewelry.  Latex.  Neomycin in triple antibiotic cream.  Preservatives in products, including clothing. SYMPTOMS  The area of skin that is exposed may develop:  Dryness or flaking.  Redness.  Cracks.  Itching.  Pain or a burning sensation.  Blisters. With allergic contact dermatitis, there may also be swelling in areas such  as the eyelids, mouth, or genitals.  DIAGNOSIS  Your caregiver can usually tell what the problem is by doing a physical exam. In cases where the cause is uncertain and an allergic contact dermatitis is suspected, a patch skin test may be performed to help determine the cause of your dermatitis. TREATMENT Treatment includes protecting the skin from further contact with the irritating substance by avoiding that substance if possible. Barrier creams, powders, and gloves may be helpful. Your caregiver may also recommend:  Steroid creams or ointments applied 2 times daily. For best results, soak the rash area in cool water for 20 minutes. Then apply the medicine. Cover the area with a plastic wrap. You can store the steroid cream in the refrigerator for a "chilly" effect on your rash. That may decrease itching. Oral steroid medicines may be needed in more severe cases.  Antibiotics or antibacterial ointments if a skin infection is present.  Antihistamine lotion or an antihistamine taken by mouth to ease itching.  Lubricants to keep moisture in your skin.  Burow's solution to reduce redness and soreness or to dry a weeping rash. Mix one packet or tablet of solution in 2 cups cool water. Dip a clean washcloth in the mixture, wring it out a bit, and put it on the affected area. Leave the cloth in place for 30 minutes. Do this as often as possible throughout the day.  Taking several cornstarch or baking soda baths daily if the area is too large to cover with a washcloth. Harsh chemicals, such as alkalis or acids, can cause skin damage that is like a burn. You should flush your skin for 15  to 20 minutes with cold water after such an exposure. You should also seek immediate medical care after exposure. Bandages (dressings), antibiotics, and pain medicine may be needed for severely irritated skin.  HOME CARE INSTRUCTIONS  Avoid the substance that caused your reaction.  Keep the area of skin that is  affected away from hot water, soap, sunlight, chemicals, acidic substances, or anything else that would irritate your skin.  Do not scratch the rash. Scratching may cause the rash to become infected.  You may take cool baths to help stop the itching.  Only take over-the-counter or prescription medicines as directed by your caregiver.  See your caregiver for follow-up care as directed to make sure your skin is healing properly. SEEK MEDICAL CARE IF:   Your condition is not better after 3 days of treatment.  You seem to be getting worse.  You see signs of infection such as swelling, tenderness, redness, soreness, or warmth in the affected area.  You have any problems related to your medicines. Document Released: 12/31/1999 Document Revised: 03/27/2011 Document Reviewed: 06/07/2010 [facility] Patient Information 2014 [place], [place].

## 2013-03-21 NOTE — ED Notes (Signed)
Pt c/o red scaly rash to R hand for the past 2 weeks. Pt recently dx'ed with eczema. Pt also has fine papular rash "all over". Pt states she is itching all over. Pt has used hydrocortisone cream, but it is not helping much. Pt alert, no acute distress. Breaths even/unlabored.

## 2013-03-21 NOTE — ED Provider Notes (Signed)
CSN: [id number]     Arrival date & time 03/21/13  1631 History  This chart was scribed for non-physician practitioner, Trixie DredgeEmily Tonye Tancredi, PA-C, working with Toy BakerAnthony T Allen, MD by Charline BillsEssence Howell, ED Scribe. This patient was seen in room WTR9/WTR9 and the patient's care was started at 5:05 PM.    Chief Complaint  Patient presents with  . Rash    The history is provided by the patient. No language interpreter was used.   HPI Comments: Morgan Schwartz is a 28 y.o. female who presents to the Emergency Department complaining of a gradually worsening, scaly erythematous rash that started on her right hand 9 months ago. She states that her OB/GYN at the time suggested hydrocortisone cream which she applied, but the rash never full resolved. She states that about 3 weeks ago, the rash became itchy and when she scratched it, she developed sores with drainage at the rash site on her head. She also states that about 3 days ago, she developed hives to her arms, chest, abdomen and neck. She denies SOB, chest tightness, dysphagia, oral swelling, weakness and numbness. She denies any changes to her foods but states that she moved to a new apartment this weekend, with new rugs and a new comforter. She also spent the weekend cleaning the old apartment.      Past Medical History  Diagnosis Date  . Abnormal Pap smear   . Anemia   . Obesity   . Late prenatal care    Past Surgical History  Procedure Laterality Date  . No past surgeries     No family history on file. History  Substance Use Topics  . Smoking status: Never Smoker   . Smokeless tobacco: Never Used  . Alcohol Use: No   OB History   Grav Para Term Preterm Abortions TAB SAB Ect Mult Living   6 3 3  2 2    3      Review of Systems  Constitutional: Negative for fever.  HENT: Negative for trouble swallowing.   Respiratory: Negative for chest tightness and shortness of breath.   Gastrointestinal: Negative for vomiting.  Skin: Positive for rash and  wound. Negative for color change.  Allergic/Immunologic: Negative for immunocompromised state.  Neurological: Negative for weakness and numbness.  Hematological: Does not bruise/bleed easily.    Allergies  Review of patient's allergies indicates no known allergies.  Home Medications   Current Outpatient Rx  Name  Route  Sig  Dispense  Refill  . acetaminophen (TYLENOL) 500 MG tablet   Oral   Take 1,000 mg by mouth every 6 (six) hours as needed for mild pain.         . hydrocortisone cream 1 %   Topical   Apply 1 application topically 2 (two) times daily as needed for itching.          Triage Vitals: BP 135/79  Pulse 60  Temp(Src) 98.3 F (36.8 C) (Oral)  Resp 16  SpO2 100% Physical Exam  Nursing note and vitals reviewed. Constitutional: She appears well-developed and well-nourished. No distress.  HENT:  Head: Normocephalic and atraumatic.  Neck: Neck supple.  Pulmonary/Chest: Effort normal.  Neurological: She is alert.  Skin: She is not diaphoretic.  Left hand: 3rd finger with scaly rash with scabbing, golden dried discharge, thickening of the skin.  Full AROM of the finger, sensation intact, capillary refill < 2 seconds.  Nontender. No induration or fluctuance.    Scattered erythematous papular rash over bilateral upper extremities  and to a lesser extent over upper chest, posterior neck.  No burrows.  No surrounding erythema, edema.  Nontender.  No discharge.      ED Course  Procedures (including critical care time) DIAGNOSTIC STUDIES: Oxygen Saturation is 100% on RA, normal by my interpretation.    COORDINATION OF CARE: 5:11 PM-Discussed treatment plan which includes a course of antibiotics, prednisone, Benadryl, Pepcid and dermatologist referrals. Also discussed return precautions with pt at bedside and pt agreed to plan.   Labs Review Labs Reviewed - No data to display Imaging Review No results found.   EKG Interpretation None      MDM   Final  diagnoses:  Rash  Wound infection  Allergic reaction    Pt with right 3rd finger with chronic skin changes, likely eczema that now has a superinfection.  No e/o underlying abscess.  Pt also with pruritic rash over upper extremities and some on upper chest and posterior neck - this is likely due to one of many environmental and chemical exposures that occurred this week.  Pt d/c home with prednisone, benadryl, pepcid, doxycycline.  Discussed findings, treatment, and follow up  with patient.  Pt given return precautions.  Pt verbalizes understanding and agrees with plan.      I personally performed the services described in this documentation, which was scribed in my presence. The recorded information has been reviewed and is accurate.    Trixie Dredge, PA-C 03/21/13 1858

## 2013-03-22 NOTE — ED Provider Notes (Signed)
Medical screening examination/treatment/procedure(s) were performed by non-physician practitioner and as supervising physician I was immediately available for consultation/collaboration.   Toy BakerAnthony T Jalacia Mattila, MD 03/22/13 [phone]

## 2013-07-17 ENCOUNTER — Emergency Department (HOSPITAL_COMMUNITY)
Admission: EM | Admit: 2013-07-17 | Discharge: 2013-07-17 | Disposition: A | Discharge status: Home / Self Care | Payer: Medicaid Other | Attending: Emergency Medicine | Admitting: Emergency Medicine

## 2013-07-17 ENCOUNTER — Encounter (HOSPITAL_COMMUNITY): Payer: Self-pay | Admitting: Emergency Medicine

## 2013-07-17 DIAGNOSIS — Y92410 Unspecified street and highway as the place of occurrence of the external cause: Secondary | ICD-10-CM | POA: Insufficient documentation

## 2013-07-17 DIAGNOSIS — S39012A Strain of muscle, fascia and tendon of lower back, initial encounter: Secondary | ICD-10-CM

## 2013-07-17 DIAGNOSIS — S0990XA Unspecified injury of head, initial encounter: Secondary | ICD-10-CM | POA: Insufficient documentation

## 2013-07-17 DIAGNOSIS — R519 Headache, unspecified: Secondary | ICD-10-CM

## 2013-07-17 DIAGNOSIS — Y9389 Activity, other specified: Secondary | ICD-10-CM | POA: Insufficient documentation

## 2013-07-17 DIAGNOSIS — S139XXA Sprain of joints and ligaments of unspecified parts of neck, initial encounter: Secondary | ICD-10-CM | POA: Insufficient documentation

## 2013-07-17 DIAGNOSIS — S335XXA Sprain of ligaments of lumbar spine, initial encounter: Secondary | ICD-10-CM | POA: Insufficient documentation

## 2013-07-17 DIAGNOSIS — R51 Headache: Secondary | ICD-10-CM

## 2013-07-17 DIAGNOSIS — S161XXA Strain of muscle, fascia and tendon at neck level, initial encounter: Secondary | ICD-10-CM

## 2013-07-17 DIAGNOSIS — Z862 Personal history of diseases of the blood and blood-forming organs and certain disorders involving the immune mechanism: Secondary | ICD-10-CM | POA: Insufficient documentation

## 2013-07-17 DIAGNOSIS — E669 Obesity, unspecified: Secondary | ICD-10-CM | POA: Insufficient documentation

## 2013-07-17 DIAGNOSIS — M62838 Other muscle spasm: Secondary | ICD-10-CM

## 2013-07-17 DIAGNOSIS — Z3202 Encounter for pregnancy test, result negative: Secondary | ICD-10-CM | POA: Insufficient documentation

## 2013-07-17 MED ORDER — SODIUM CHLORIDE 0.9 % IV BOLUS (SEPSIS)
1000.0000 mL | INTRAVENOUS | Status: AC
  Administered 2013-07-17: 1000 mL via INTRAVENOUS

## 2013-07-17 MED ORDER — IBUPROFEN 800 MG PO TABS: 800.0000 mg | ORAL_TABLET | Freq: Three times a day (TID) | ORAL | Status: DC

## 2013-07-17 MED ORDER — KETOROLAC TROMETHAMINE 30 MG/ML IJ SOLN
30.0000 mg | Freq: Once | INTRAMUSCULAR | Status: AC
  Administered 2013-07-17: 30 mg via INTRAVENOUS
  Filled 2013-07-17: qty 1

## 2013-07-17 MED ORDER — DIPHENHYDRAMINE HCL 50 MG/ML IJ SOLN
25.0000 mg | Freq: Once | INTRAMUSCULAR | Status: AC
  Administered 2013-07-17: 25 mg via INTRAVENOUS
  Filled 2013-07-17: qty 1

## 2013-07-17 MED ORDER — METOCLOPRAMIDE HCL 5 MG/ML IJ SOLN
10.0000 mg | Freq: Once | INTRAMUSCULAR | Status: AC
  Administered 2013-07-17: 10 mg via INTRAVENOUS
  Filled 2013-07-17: qty 2

## 2013-07-17 MED ORDER — DIAZEPAM 5 MG PO TABS: 5.0000 mg | ORAL_TABLET | Freq: Three times a day (TID) | ORAL | Status: DC | PRN

## 2013-07-17 NOTE — Discharge Instructions (Signed)
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
## 2013-07-17 NOTE — ED Notes (Signed)
Pt c/o headache, neck pain and lower back pain x 7-9 days. Pt states that she has taken OTC pain meds but havent helped.

## 2013-07-17 NOTE — ED Provider Notes (Signed)
Medical screening examination/treatment/procedure(s) were performed by non-physician practitioner and as supervising physician I was immediately available for consultation/collaboration.   EKG Interpretation None       Hayze Gazda R. Brayan Votaw, MD 07/17/13 2358 
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
## 2013-07-22 ENCOUNTER — Inpatient Hospital Stay (HOSPITAL_COMMUNITY): Payer: Medicaid Other

## 2013-07-22 ENCOUNTER — Encounter (HOSPITAL_COMMUNITY): Payer: Self-pay | Admitting: *Deleted

## 2013-07-22 ENCOUNTER — Inpatient Hospital Stay (HOSPITAL_COMMUNITY)
Admission: AD | Admit: 2013-07-22 | Discharge: 2013-07-22 | Disposition: A | Discharge status: Home / Self Care | Payer: Medicaid Other | Source: Ambulatory Visit | Attending: Obstetrics & Gynecology | Admitting: Obstetrics & Gynecology

## 2013-07-22 DIAGNOSIS — O26899 Other specified pregnancy related conditions, unspecified trimester: Secondary | ICD-10-CM

## 2013-07-22 DIAGNOSIS — R51 Headache: Secondary | ICD-10-CM | POA: Insufficient documentation

## 2013-07-22 DIAGNOSIS — R519 Headache, unspecified: Secondary | ICD-10-CM

## 2013-07-22 DIAGNOSIS — O209 Hemorrhage in early pregnancy, unspecified: Secondary | ICD-10-CM

## 2013-07-22 DIAGNOSIS — Z30432 Encounter for removal of intrauterine contraceptive device: Secondary | ICD-10-CM | POA: Insufficient documentation

## 2013-07-22 DIAGNOSIS — O26859 Spotting complicating pregnancy, unspecified trimester: Secondary | ICD-10-CM | POA: Insufficient documentation

## 2013-07-22 DIAGNOSIS — O4690 Antepartum hemorrhage, unspecified, unspecified trimester: Secondary | ICD-10-CM

## 2013-07-22 LAB — URINE MICROSCOPIC-ADD ON

## 2013-07-22 LAB — URINALYSIS, ROUTINE W REFLEX MICROSCOPIC
BILIRUBIN URINE: NEGATIVE
GLUCOSE, UA: NEGATIVE mg/dL
KETONES UR: NEGATIVE mg/dL
Nitrite: NEGATIVE
PH: 6.5 (ref 5.0–8.0)
Protein, ur: NEGATIVE mg/dL
SPECIFIC GRAVITY, URINE: 1.01 (ref 1.005–1.030)
Urobilinogen, UA: 0.2 mg/dL (ref 0.0–1.0)

## 2013-07-22 LAB — CBC
HEMATOCRIT: 35.6 % — AB (ref 36.0–46.0)
HEMOGLOBIN: 11.4 g/dL — AB (ref 12.0–15.0)
MCH: 23.8 pg — AB (ref 26.0–34.0)
MCHC: 32 g/dL (ref 30.0–36.0)
MCV: 74.3 fL — ABNORMAL LOW (ref 78.0–100.0)
Platelets: 259 10*3/uL (ref 150–400)
RBC: 4.79 MIL/uL (ref 3.87–5.11)
RDW: 15.7 % — ABNORMAL HIGH (ref 11.5–15.5)
WBC: 11.2 10*3/uL — ABNORMAL HIGH (ref 4.0–10.5)

## 2013-07-22 LAB — WET PREP, GENITAL
Clue Cells Wet Prep HPF POC: NONE SEEN
Trich, Wet Prep: NONE SEEN
Yeast Wet Prep HPF POC: NONE SEEN

## 2013-07-22 LAB — HCG, QUANTITATIVE, PREGNANCY: hCG, Beta Chain, Quant, S: 22757 m[IU]/mL — ABNORMAL HIGH (ref <5)

## 2013-07-22 LAB — POCT PREGNANCY, URINE: PREG TEST UR: POSITIVE — AB

## 2013-07-22 MED ORDER — SUMATRIPTAN SUCCINATE 50 MG PO TABS: 50.0000 mg | ORAL_TABLET | ORAL | Status: DC | PRN

## 2013-07-22 MED ORDER — CYCLOBENZAPRINE HCL 10 MG PO TABS
10.0000 mg | ORAL_TABLET | Freq: Once | ORAL | Status: AC
  Administered 2013-07-22: 10 mg via ORAL
  Filled 2013-07-22: qty 1

## 2013-07-22 NOTE — Progress Notes (Signed)
Written and verbal d/c instructions given and understanding voiced. 

## 2013-07-22 NOTE — MAU Note (Addendum)
Has IUD, has symptoms of being preg. Had +preg home test on Sat, and + at health dept today.  Was told to come here to make sure it is not a tubal preg.  Low back pain for past 2 wks, and a really bad headache that won't go away, also nauseated.

## 2013-07-22 NOTE — Discharge Instructions (Signed)

## 2013-07-22 NOTE — MAU Provider Note (Signed)
History     CSN: [id number]  Arrival date and time: 07/22/13 1154   First Provider Initiated Contact with Patient 07/22/13 1435      Chief Complaint  Patient presents with  . Possible Pregnancy   HPI Comments: Patient is a 28 year old African American [phone] female with a Mirena who presents today with a positive pregnancy test. LMP was 06/19/2013. She states that she started spotting on Saturday 07/19/2013 and continues with spotting currently. Also endorses an occipital/back of neck headache for the past 2 weeks. States that it is constant and that she has been unable to find relief. Has tried Excedrin migraine and a cold/sinus medication without relief. States that initially she had blurred vision but that has since resolved. Endorses nausea and lower back pain for past 2 weeks. States that these symptoms have been present early on in past pregnancies. Denies fever, chills, night sweats, and fatigue. Also denies vomiting, diarrhea and constipation. Complains of sore throat for past 2 days as well.  Possible Pregnancy Associated symptoms include headaches and nausea. Pertinent negatives include no abdominal pain, chills, fever or vomiting.   Pt plans to have a termination of pregnancy.    Past Medical History  Diagnosis Date  . Abnormal Pap smear   . Anemia   . Obesity   . Late prenatal care     Past Surgical History  Procedure Laterality Date  . No past surgeries      Family History  Problem Relation Age of Onset  . Alcohol abuse Neg Hx     History  Substance Use Topics  . Smoking status: Never Smoker   . Smokeless tobacco: Never Used  . Alcohol Use: No    Allergies: No Known Allergies  Prescriptions prior to admission  Medication Sig Dispense Refill  . acetaminophen (TYLENOL) 500 MG tablet Take 1,000 mg by mouth every 6 (six) hours as needed for mild pain.      Marland Kitchen. aspirin-acetaminophen-caffeine (EXCEDRIN MIGRAINE) 250-250-65 MG per tablet Take 2 tablets by mouth  every 6 (six) hours as needed for headache.      . diazepam (VALIUM) 5 MG tablet Take 1 tablet (5 mg total) by mouth every 8 (eight) hours as needed for anxiety.  5 tablet  0  . Phenyleph-CPM-DM-APAP (TYLENOL COLD MULTI-SYMPTOM PO) Take 1 tablet by mouth as needed (headache, sore throat).        Review of Systems  Constitutional: Negative for fever, chills and malaise/fatigue.  Eyes: Negative for blurred vision and double vision.  Gastrointestinal: Positive for nausea. Negative for vomiting, abdominal pain and diarrhea.  Neurological: Positive for headaches.   Physical Exam   Blood pressure 142/76, pulse 79, temperature 98.7 F (37.1 C), temperature source Oral, resp. rate 18, height 5' 7.75" (1.721 m), weight 102.513 kg (226 lb), last menstrual period 06/19/2013, SpO2 100.00%, unknown if currently breastfeeding.  Physical Exam  Constitutional: She is oriented to person, place, and time. She appears well-developed and well-nourished.  Neck: No Brudzinski's sign noted.  Cardiovascular: Normal rate and regular rhythm.   Respiratory: Effort normal and breath sounds normal. She has no wheezes. She has no rales.  Lymphadenopathy:    She has no cervical adenopathy.  Neurological: She is alert and oriented to person, place, and time.  Skin: Skin is warm and dry.    MAU Course  Procedures  Results for orders placed during the hospital encounter of 07/22/13 (from the past 24 hour(s))  CBC     Status:  Abnormal   Collection Time    07/22/13  1:33 PM      Result Value Ref Range   WBC 11.2 (*) 4.0 - 10.5 K/uL   RBC 4.79  3.87 - 5.11 MIL/uL   Hemoglobin 11.4 (*) 12.0 - 15.0 g/dL   HCT 30.835.6 (*) 65.736.0 - 84.646.0 %   MCV 74.3 (*) 78.0 - 100.0 fL   MCH 23.8 (*) 26.0 - 34.0 pg   MCHC 32.0  30.0 - 36.0 g/dL   RDW 96.215.7 (*) 95.211.5 - 84.115.5 %   Platelets 259  150 - 400 K/uL  HCG, QUANTITATIVE, PREGNANCY     Status: Abnormal   Collection Time    07/22/13  1:33 PM      Result Value Ref Range   hCG,  Beta Chain, Quant, S [id number] (*) <5 mIU/mL  URINALYSIS, ROUTINE W REFLEX MICROSCOPIC     Status: Abnormal   Collection Time    07/22/13  2:05 PM      Result Value Ref Range   Color, Urine YELLOW  YELLOW   APPearance CLEAR  CLEAR   Specific Gravity, Urine 1.010  1.005 - 1.030   pH 6.5  5.0 - 8.0   Glucose, UA NEGATIVE  NEGATIVE mg/dL   Hgb urine dipstick TRACE (*) NEGATIVE   Bilirubin Urine NEGATIVE  NEGATIVE   Ketones, ur NEGATIVE  NEGATIVE mg/dL   Protein, ur NEGATIVE  NEGATIVE mg/dL   Urobilinogen, UA 0.2  0.0 - 1.0 mg/dL   Nitrite NEGATIVE  NEGATIVE   Leukocytes, UA SMALL (*) NEGATIVE  URINE MICROSCOPIC-ADD ON     Status: None   Collection Time    07/22/13  2:05 PM      Result Value Ref Range   Squamous Epithelial / LPF RARE  RARE   WBC, UA 7-10  <3 WBC/hpf   RBC / HPF 0-2  <3 RBC/hpf   Bacteria, UA RARE  RARE  POCT PREGNANCY, URINE     Status: Abnormal   Collection Time    07/22/13  2:13 PM      Result Value Ref Range   Preg Test, Ur POSITIVE (*) NEGATIVE   Wet Prep - negative  Ultrasound:  FINDINGS:  Intrauterine gestational sac: Visualized/normal in shape.  Yolk sac: Visualized  Embryo: Visualized  Cardiac Activity: Visualized  Heart Rate: 107 bpm  MSD: mm w d  CRL: 5.1 mm 6 w 2 d US EDC: 03/15/2014  Maternal uterus/adnexae: No subchorionic hemorrhage. IUD is located  in the cervix and lower uterine segment region. Right corpus luteum  cyst. No adnexal masses. Trace free fluid.  IMPRESSION:  Six week 2 day intrauterine pregnancy. Fetal heart rate 107 beats  per min.  IUD in the cervix/lower uterine segment region.   Procedure:  Pt placed in lithotomy position.  Speculum inserted and  IUD removed without difficulty. Spotting of blood present after removal.    Flexeril PO given for headache > pt reports mild relief Assessment and Plan   28 yo [id number] at 6.2 wks IUP IUD Removal Headaches  Plan: Discharge to home RX Imitrex 50 mg take one, may repeat in  2 hours for max dose of 100 mg.  Follow-up if headaches worsen or do not improve with medication.   Bleeding precautions Melissa NoonWalidah N Muhammad, CNM

## 2013-07-23 LAB — GC/CHLAMYDIA PROBE AMP
CT PROBE, AMP APTIMA: NEGATIVE
GC Probe RNA: NEGATIVE

## 2013-07-28 NOTE — MAU Provider Note (Signed)
Attestation of Attending Supervision of Advanced Practitioner (CNM/NP): Evaluation and management procedures were performed by the Advanced Practitioner under my supervision and collaboration. I have reviewed the Advanced Practitioner's note and chart, and I agree with the management and plan.  Jamarl Pew H. 9:41 AM   

## 2013-11-17 ENCOUNTER — Encounter (HOSPITAL_COMMUNITY): Payer: Self-pay | Admitting: *Deleted

## 2014-05-27 ENCOUNTER — Encounter (HOSPITAL_COMMUNITY): Payer: Self-pay | Admitting: *Deleted

## 2014-09-30 ENCOUNTER — Encounter: Payer: Self-pay | Admitting: Emergency Medicine

## 2014-09-30 ENCOUNTER — Emergency Department
Admission: EM | Admit: 2014-09-30 | Discharge: 2014-09-30 | Disposition: A | Discharge status: Home / Self Care | Payer: 59 | Attending: Emergency Medicine | Admitting: Emergency Medicine

## 2014-09-30 DIAGNOSIS — R51 Headache: Secondary | ICD-10-CM | POA: Diagnosis present

## 2014-09-30 DIAGNOSIS — G44209 Tension-type headache, unspecified, not intractable: Secondary | ICD-10-CM

## 2014-09-30 MED ORDER — KETOROLAC TROMETHAMINE 60 MG/2ML IM SOLN
INTRAMUSCULAR | Status: AC
  Administered 2014-09-30: 60 mg
  Filled 2014-09-30: qty 2

## 2014-09-30 MED ORDER — PROCHLORPERAZINE MALEATE 10 MG PO TABS: 10.0000 mg | ORAL_TABLET | Freq: Four times a day (QID) | ORAL | Status: DC | PRN

## 2014-09-30 MED ORDER — LORAZEPAM 1 MG PO TABS: 1.0000 mg | ORAL_TABLET | Freq: Three times a day (TID) | ORAL | Status: DC | PRN

## 2014-09-30 MED ORDER — TRAMADOL HCL 50 MG PO TABS: 50.0000 mg | ORAL_TABLET | Freq: Four times a day (QID) | ORAL | Status: DC | PRN

## 2014-09-30 MED ORDER — KETOROLAC TROMETHAMINE 30 MG/ML IJ SOLN: 60.0000 mg | Freq: Once | INTRAMUSCULAR | Status: AC

## 2014-09-30 NOTE — ED Provider Notes (Signed)
Time Seen: Approximately 2105 I have reviewed the triage notes  Chief Complaint: Headache   History of Present Illness: Morgan Schwartz is a 29 y.o. female who presents with a recent occipital headache. Patient states she had a headache most of the weekend and points mainly to the exit total area. She states it seemed to start in the upper part of her back moves through the neck and then come forward into the occipital area. She states the headache seems to get worse throughout the day. She's had some occasional nausea but no persistent vomiting. She denies any fever or photophobia. She denies any focal weakness in either upper or lower extremities. She had a workup for headache remotely in the past where she has spinal tap for suspected meningitis but otherwise denies any chronic headache past. Patient states she's tried over-the-counter Tylenol and Motrin without any successful relief.   Past Medical History  Diagnosis Date  . Abnormal Pap smear   . Anemia   . Obesity   . Late prenatal care     Patient Active Problem List   Diagnosis Date Noted  . LLQ abdominal pain 10/08/2011    Past Surgical History  Procedure Laterality Date  . No past surgeries      Past Surgical History  Procedure Laterality Date  . No past surgeries      Current Outpatient Rx  Name  Route  Sig  Dispense  Refill  . acetaminophen (TYLENOL) 500 MG tablet   Oral   Take 1,000 mg by mouth every 6 (six) hours as needed for mild pain.         Marland Kitchen LORazepam (ATIVAN) 1 MG tablet   Oral   Take 1 tablet (1 mg total) by mouth every 8 (eight) hours as needed for anxiety.   30 tablet   0   . Phenyleph-CPM-DM-APAP (TYLENOL COLD MULTI-SYMPTOM PO)   Oral   Take 1 tablet by mouth as needed (headache, sore throat).         . prochlorperazine (COMPAZINE) 10 MG tablet   Oral   Take 1 tablet (10 mg total) by mouth every 6 (six) hours as needed for nausea.   30 tablet   1   . SUMAtriptan (IMITREX) 50 MG  tablet   Oral   Take 1 tablet (50 mg total) by mouth every 2 (two) hours as needed for migraine or headache. May repeat in 2 hours if headache persists or recurs.   6 tablet   0   . traMADol (ULTRAM) 50 MG tablet   Oral   Take 1 tablet (50 mg total) by mouth every 6 (six) hours as needed.   20 tablet   0     Allergies:  Review of patient's allergies indicates no known allergies.  Family History: Family History  Problem Relation Age of Onset  . Alcohol abuse Neg Hx     Social History: Social History  Substance Use Topics  . Smoking status: Never Smoker   . Smokeless tobacco: Never Used  . Alcohol Use: No     Review of Systems:   10 point review of systems was performed and was otherwise negative:  Constitutional: No fever Eyes: No visual disturbances Cardiac: No chest pain Respiratory: No shortness of breath, wheezing, or stridor Abdomen: No abdominal pain, no vomiting, No diarrhea Extremities: No peripheral edema, cyanosis Skin: No rashes, easy bruising Neurologic: No focal weakness, trouble with speech or swollowing No head trauma Physical Exam:  ED Triage  Vitals  Enc Vitals Group     BP 09/30/14 1933 109/54 mmHg     Pulse Rate 09/30/14 1933 88     Resp 09/30/14 1933 20     Temp 09/30/14 1933 98.1 F (36.7 C)     Temp Source 09/30/14 1933 Oral     SpO2 09/30/14 1933 99 %     Weight 09/30/14 1933 230 lb (104.327 kg)     Height 09/30/14 1933 5\' 10"  (1.778 m)     Head Cir --      Peak Flow --      Pain Score 09/30/14 1934 8     Pain Loc --      Pain Edu? --      Excl. in GC? --     General: Awake , Alert , and Oriented times 3; GCS 15 Head: Patient has some reproducible pain over the occipital area, Normal cephalic , atraumatic Eyes: Pupils equal , round, reactive to light Nose/Throat: No nasal drainage, patent upper airway without erythema or exudate.  Neck: No meningeal signs, Supple, Full range of motion, No anterior adenopathy or palpable  thyroid masses Lungs: Clear to ascultation without wheezes , rhonchi, or rales Heart: Regular rate, regular rhythm without murmurs , gallops , or rubs Abdomen: Soft, non tender without rebound, guarding , or rigidity; bowel sounds positive and symmetric in all 4 quadrants. No organomegaly .        Extremities: 2 plus symmetric pulses. No edema, clubbing or cyanosis Neurologic: normal ambulation, Motor symmetric without deficits, sensory intact Skin: warm, dry, no rashes     ED Course:  Patient received Toradol 60 mg IM. Differential diagnosis includes but is not exclusive to subarachnoid hemorrhage, meningitis, encephalitis, previous head trauma, cavernous venous thrombosis, muscle tension headache, temporal arteritis, migraine or migraine equivalent, etc.  Given the patient's current clinical presentation and objective findings I felt most likely this is a muscle tension headache. I felt the patient did not require any imaging or lumbar puncture at this time from a clinical standpoint.    Assessment: Muscle tension headache   Final Clinical Impression:  Final diagnoses:  Tension-type headache, not intractable, unspecified chronicity pattern     Plan:  Patient was advised to return immediately if condition worsens. Patient was advised to follow up with her primary care physician or other specialized physicians involved and in their current assessment.             Jennye Moccasin, MD 09/30/14 [phone]

## 2014-09-30 NOTE — Discharge Instructions (Signed)
Tension Headache A tension headache is a feeling of pain, pressure, or aching often felt over the front and sides of the head. The pain can be dull or can feel tight (constricting). It is the most common type of headache. Tension headaches are not normally associated with nausea or vomiting and do not get worse with physical activity. Tension headaches can last 30 minutes to several days.  CAUSES  The exact cause is not known, but it may be caused by chemicals and hormones in the brain that lead to pain. Tension headaches often begin after stress, anxiety, or depression. Other triggers may include:  Alcohol.  Caffeine (too much or withdrawal).  Respiratory infections (colds, flu, sinus infections).  Dental problems or teeth clenching.  Fatigue.  Holding your head and neck in one position too long while using a computer. SYMPTOMS   Pressure around the head.   Dull, aching head pain.   Pain felt over the front and sides of the head.   Tenderness in the muscles of the head, neck, and shoulders. DIAGNOSIS  A tension headache is often diagnosed based on:   Symptoms.   Physical examination.   A CT scan or MRI of your head. These tests may be ordered if symptoms are severe or unusual. TREATMENT  Medicines may be given to help relieve symptoms.  HOME CARE INSTRUCTIONS   Only take over-the-counter or prescription medicines for pain or discomfort as directed by your caregiver.   Lie down in a dark, quiet room when you have a headache.   Keep a journal to find out what may be triggering your headaches. For example, write down:  What you eat and drink.  How much sleep you get.  Any change to your diet or medicines.  Try massage or other relaxation techniques.   Ice packs or heat applied to the head and neck can be used. Use these 3 to 4 times per day for 15 to 20 minutes each time, or as needed.   Limit stress.   Sit up straight, and do not tense your muscles.    Quit smoking if you smoke.  Limit alcohol use.  Decrease the amount of caffeine you drink, or stop drinking caffeine.  Eat and exercise regularly.  Get 7 to 9 hours of sleep, or as recommended by your caregiver.  Avoid excessive use of pain medicine as recurrent headaches can occur.  SEEK MEDICAL CARE IF:   You have problems with the medicines you were prescribed.  Your medicines do not work.  You have a change from the usual headache.  You have nausea or vomiting. SEEK IMMEDIATE MEDICAL CARE IF:   Your headache becomes severe.  You have a fever.  You have a stiff neck.  You have loss of vision.  You have muscular weakness or loss of muscle control.  You lose your balance or have trouble walking.  You feel faint or pass out.  You have severe symptoms that are different from your first symptoms. MAKE SURE YOU:   Understand these instructions.  Will watch your condition.  Will get help right away if you are not doing well or get worse. Document Released: 01/02/2005 Document Revised: 03/27/2011 Document Reviewed: 12/23/2010 [facility] Patient Information 2015 [place], [place]. This information is not intended to replace advice given to you by your health care provider. Make sure you discuss any questions you have with your health care provider.   Please return immediately if condition worsens. Please contact her primary physician  or the physician you were given for referral. If you have any specialist physicians involved in her treatment and plan please also contact them. Thank you for using [facility]. Please continue with over-the-counter ibuprofen as directed. Return immediately to the emergency department for any weakness, increasing photophobia, persistent vomiting or any other new concerns.

## 2014-09-30 NOTE — ED Notes (Signed)
Patient ambulatory to triage with steady gait, without difficulty or distress noted; pt reports occipital HA since Sunday with no accomp symptoms; denies recent illness; st hx of having blood patch after spinal tap 34yrs ago but no other hx of HA

## 2014-12-30 ENCOUNTER — Encounter (HOSPITAL_COMMUNITY): Payer: Self-pay

## 2014-12-30 ENCOUNTER — Inpatient Hospital Stay (HOSPITAL_COMMUNITY)
Admission: AD | Admit: 2014-12-30 | Discharge: 2014-12-31 | Disposition: A | Discharge status: Home / Self Care | Payer: 59 | Source: Ambulatory Visit | Attending: Obstetrics and Gynecology | Admitting: Obstetrics and Gynecology

## 2014-12-30 DIAGNOSIS — Z3A08 8 weeks gestation of pregnancy: Secondary | ICD-10-CM | POA: Diagnosis not present

## 2014-12-30 DIAGNOSIS — R112 Nausea with vomiting, unspecified: Secondary | ICD-10-CM | POA: Diagnosis present

## 2014-12-30 DIAGNOSIS — O219 Vomiting of pregnancy, unspecified: Secondary | ICD-10-CM | POA: Diagnosis not present

## 2014-12-30 DIAGNOSIS — O210 Mild hyperemesis gravidarum: Secondary | ICD-10-CM | POA: Diagnosis not present

## 2014-12-30 LAB — URINALYSIS, ROUTINE W REFLEX MICROSCOPIC
Bilirubin Urine: NEGATIVE
Glucose, UA: NEGATIVE mg/dL
HGB URINE DIPSTICK: NEGATIVE
KETONES UR: NEGATIVE mg/dL
Nitrite: NEGATIVE
PROTEIN: NEGATIVE mg/dL
Specific Gravity, Urine: 1.015 (ref 1.005–1.030)
pH: 6 (ref 5.0–8.0)

## 2014-12-30 LAB — URINE MICROSCOPIC-ADD ON

## 2014-12-30 LAB — POCT PREGNANCY, URINE: PREG TEST UR: POSITIVE — AB

## 2014-12-30 MED ORDER — PROMETHAZINE HCL 25 MG/ML IJ SOLN
25.0000 mg | Freq: Once | INTRAMUSCULAR | Status: AC
  Administered 2014-12-31: 25 mg via INTRAMUSCULAR
  Filled 2014-12-30: qty 1

## 2014-12-30 NOTE — MAU Note (Signed)
Pt states that she has not been able to keep anything down today. States she tried ginger ale, saltines, etc and thew them up. Denies fever or diarrhea. LMP: 11/24/2014. Denies vag bleeding or abdominal pain. Has a headache-taken tylenol and aleve and nothing has helped. States urine has a strong odor and inceased frequency but denies pain and burning

## 2014-12-31 ENCOUNTER — Encounter (HOSPITAL_COMMUNITY): Payer: Self-pay

## 2014-12-31 DIAGNOSIS — O219 Vomiting of pregnancy, unspecified: Secondary | ICD-10-CM | POA: Diagnosis not present

## 2014-12-31 MED ORDER — PROMETHAZINE HCL 25 MG PO TABS: 12.5000 mg | ORAL_TABLET | Freq: Four times a day (QID) | ORAL | Status: DC | PRN

## 2014-12-31 NOTE — MAU Provider Note (Signed)
History     CSN: [id number]  Arrival date and time: 12/30/14 2243   First Provider Initiated Contact with Patient 12/31/14 0004      Chief Complaint  Patient presents with  . Nausea  . Emesis   HPI Comments: Morgan Schwartz is a 29 y.o. [id number] at Unknown who presents today with nausea and vomiting. She states that until she has felt fine. She states that she has vomited about 6 times today. She denies any abdominal pain or vaginal bleeding.   Emesis  This is a new problem. The current episode started today. The problem occurs 5 to 10 times per day. The problem has been gradually worsening. The emesis has an appearance of stomach contents. Pertinent negatives include no abdominal pain, chills, diarrhea or fever. Risk factors: pregnancy  She has tried nothing for the symptoms.     Past Medical History  Diagnosis Date  . Abnormal Pap smear   . Anemia   . Obesity   . Late prenatal care     Past Surgical History  Procedure Laterality Date  . No past surgeries      Family History  Problem Relation Age of Onset  . Alcohol abuse Neg Hx     Social History  Substance Use Topics  . Smoking status: Never Smoker   . Smokeless tobacco: Never Used  . Alcohol Use: No    Allergies: No Known Allergies  Prescriptions prior to admission  Medication Sig Dispense Refill Last Dose  . acetaminophen (TYLENOL) 500 MG tablet Take 1,000 mg by mouth every 6 (six) hours as needed for mild pain.   Past Week at Unknown time  . LORazepam (ATIVAN) 1 MG tablet Take 1 tablet (1 mg total) by mouth every 8 (eight) hours as needed for anxiety. 30 tablet 0   . Phenyleph-CPM-DM-APAP (TYLENOL COLD MULTI-SYMPTOM PO) Take 1 tablet by mouth as needed (headache, sore throat).   07/21/2013 at Unknown time  . prochlorperazine (COMPAZINE) 10 MG tablet Take 1 tablet (10 mg total) by mouth every 6 (six) hours as needed for nausea. 30 tablet 1   . SUMAtriptan (IMITREX) 50 MG tablet Take 1 tablet (50 mg total) by  mouth every 2 (two) hours as needed for migraine or headache. May repeat in 2 hours if headache persists or recurs. 6 tablet 0   . traMADol (ULTRAM) 50 MG tablet Take 1 tablet (50 mg total) by mouth every 6 (six) hours as needed. 20 tablet 0     Review of Systems  Constitutional: Negative for fever and chills.  Gastrointestinal: Positive for nausea and vomiting. Negative for abdominal pain, diarrhea and constipation.  Genitourinary: Negative for dysuria, urgency and frequency.   Physical Exam   Blood pressure 136/89, pulse 81, temperature 99 F (37.2 C), temperature source Oral, resp. rate 18, height 5' 8.5" (1.74 m), weight 116.756 kg (257 lb 6.4 oz), last menstrual period 11/24/2014, SpO2 98 %, unknown if currently breastfeeding.  Physical Exam  Nursing note and vitals reviewed. Constitutional: She is oriented to person, place, and time. She appears well-developed and well-nourished. No distress.  HENT:  Head: Normocephalic.  Cardiovascular: Normal rate.   Respiratory: Effort normal.  GI: Soft. There is no tenderness. There is no rebound.  Neurological: She is alert and oriented to person, place, and time.  Skin: Skin is warm.  Psychiatric: She has a normal mood and affect.   Results for orders placed or performed during the hospital encounter of 12/30/14 (from the past 24  hour(s))  Urinalysis, Routine w reflex microscopic (not at [facility])     Status: Abnormal   Collection Time: 12/30/14 11:01 PM  Result Value Ref Range   Color, Urine YELLOW YELLOW   APPearance HAZY (A) CLEAR   Specific Gravity, Urine 1.015 1.005 - 1.030   pH 6.0 5.0 - 8.0   Glucose, UA NEGATIVE NEGATIVE mg/dL   Hgb urine dipstick NEGATIVE NEGATIVE   Bilirubin Urine NEGATIVE NEGATIVE   Ketones, ur NEGATIVE NEGATIVE mg/dL   Protein, ur NEGATIVE NEGATIVE mg/dL   Nitrite NEGATIVE NEGATIVE   Leukocytes, UA TRACE (A) NEGATIVE  Urine microscopic-add on     Status: Abnormal   Collection Time: 12/30/14 11:01 PM   Result Value Ref Range   Squamous Epithelial / LPF 6-30 (A) NONE SEEN   WBC, UA 0-5 0 - 5 WBC/hpf   RBC / HPF 0-5 0 - 5 RBC/hpf   Bacteria, UA FEW (A) NONE SEEN   Urine-Other MUCOUS PRESENT   Pregnancy, urine POC     Status: Abnormal   Collection Time: 12/30/14 11:24 PM  Result Value Ref Range   Preg Test, Ur POSITIVE (A) NEGATIVE    MAU Course  Procedures  MDM Patient has had phenergan, and it keeping down PO fluids.  Assessment and Plan   1. Nausea and vomiting during pregnancy prior to [redacted] weeks gestation    DC home Comfort measures reviewed  1st Trimester precautions  RX: phenergan #30 PRN  Return to MAU as needed FU with OB as planned  Follow-up Information    Follow up with Zenaida Niece, MD.   Specialty:  Obstetrics and Gynecology   Contact information:   [address], [address] [place] [address] [phone]         Tawnya Crook 12/31/2014, 12:06 AM

## 2014-12-31 NOTE — Discharge Instructions (Signed)

## 2015-09-09 ENCOUNTER — Encounter: Payer: Self-pay | Admitting: Obstetrics and Gynecology

## 2015-09-09 ENCOUNTER — Ambulatory Visit (INDEPENDENT_AMBULATORY_CARE_PROVIDER_SITE_OTHER): Payer: 59 | Admitting: Obstetrics and Gynecology

## 2015-09-09 VITALS — BP 124/82 | HR 74 | Temp 98.3°F | Ht 70.0 in | Wt 250.4 lb

## 2015-09-09 DIAGNOSIS — Z3202 Encounter for pregnancy test, result negative: Secondary | ICD-10-CM | POA: Diagnosis not present

## 2015-09-09 DIAGNOSIS — Z30014 Encounter for initial prescription of intrauterine contraceptive device: Secondary | ICD-10-CM

## 2015-09-09 DIAGNOSIS — Z30430 Encounter for insertion of intrauterine contraceptive device: Secondary | ICD-10-CM | POA: Diagnosis not present

## 2015-09-09 DIAGNOSIS — Z01419 Encounter for gynecological examination (general) (routine) without abnormal findings: Secondary | ICD-10-CM

## 2015-09-09 LAB — POCT URINE PREGNANCY: Preg Test, Ur: NEGATIVE

## 2015-09-09 NOTE — Progress Notes (Signed)
Subjective:     Morgan Schwartz is a 30 y.o. female [phone] with BMI 36 who is here for a comprehensive physical exam. The patient reports no problems. [facility] She desires contraception. She states that she conceived while using the NuvaRing and the 3 year IUD. She has not been sexually active since her termination. She reports normal monthly cycles with 5 days of vaginal bleeding. She denies any pelvic pain or abnormal discharge.  Past Medical History:  Diagnosis Date  . Abnormal Pap smear   . Anemia   . Late prenatal care   . Obesity    Past Surgical History:  Procedure Laterality Date  . NO PAST SURGERIES     Family History  Problem Relation Age of Onset  . Alcohol abuse Neg Hx     Social History   Social History  . Marital status: Single    Spouse name: N/A  . Number of children: N/A  . Years of education: N/A   Occupational History  . Not on file.   Social History Main Topics  . Smoking status: Never Smoker  . Smokeless tobacco: Never Used  . Alcohol use No  . Drug use: No  . Sexual activity: Yes    Birth control/ protection: IUD   Other Topics Concern  . Not on file   Social History Narrative  . No narrative on file   Health Maintenance  Topic Date Due  . PAP SMEAR  12/21/2007  . INFLUENZA VACCINE  08/17/2015  . TETANUS/TDAP  06/13/2022  . HIV Screening  Completed       Review of Systems Pertinent items are noted in HPI.   Objective:  Blood pressure 124/82, pulse 74, temperature 98.3 F (36.8 C), temperature source Oral, height 5\' 10"  (1.778 m), weight 250 lb 6.4 oz (113.6 kg), last menstrual period 08/27/2015, unknown if currently breastfeeding.     GENERAL: Well-developed, well-nourished female in no acute distress.  HEENT: Normocephalic, atraumatic. Sclerae anicteric.  NECK: Supple. Normal thyroid.  LUNGS: Clear to auscultation bilaterally.  HEART: Regular rate and  rhythm. BREASTS: Symmetric in size. No palpable masses or lymphadenopathy, skin changes, or nipple drainage. ABDOMEN: Soft, nontender, nondistended. No organomegaly. PELVIC: Normal external female genitalia. Vagina is pink and rugated.  Normal discharge. Normal appearing cervix. Uterus is normal in size. No adnexal mass or tenderness. EXTREMITIES: No cyanosis, clubbing, or edema, 2+ distal pulses.    Assessment:    Healthy female exam.      Plan:    pap smear collected STI testing performed per patient request Discussed the different birth control options available and patient opted for Mirena IUD IUD Procedure Note Patient identified, informed consent performed, signed copy in chart, time out was performed.  Urine pregnancy test negative.  Speculum placed in the vagina.  Cervix visualized.  Cleaned with Betadine x 2.  Grasped anteriorly with a single tooth tenaculum.  Uterus sounded to 8 cm.  Mirena IUD placed per manufacturer's recommendations (lot [id number]  Exp 2/20).  Strings trimmed to 3 cm. Tenaculum was removed, good hemostasis noted.  Patient tolerated procedure well.   Patient given post procedure instructions and Mirena care card with expiration date.  Patient is asked to check IUD strings periodically and follow up in 4-6 weeks for IUD check.   See After Visit Summary for Counseling Recommendations

## 2015-09-09 NOTE — Addendum Note (Signed)
Addended by: Lear NgMARTIN, Adron Geisel L on: 09/09/2015 10:39 AM   Modules accepted: Orders

## 2015-09-09 NOTE — Patient Instructions (Addendum)
Levonorgestrel intrauterine device (IUD) What is this medicine? LEVONORGESTREL IUD (LEE voe nor jes trel) is a contraceptive (birth control) device. The device is placed inside the uterus by a healthcare professional. It is used to prevent pregnancy and can also be used to treat heavy bleeding that occurs during your period. Depending on the device, it can be used for 3 to 5 years. This medicine may be used for other purposes; ask your health care provider or pharmacist if you have questions. What should I tell my health care provider before I take this medicine? They need to know if you have any of these conditions: -abnormal Pap smear -cancer of the breast, uterus, or cervix -diabetes -endometritis -genital or pelvic infection now or in the past -have more than one sexual partner or your partner has more than one partner -heart disease -history of an ectopic or tubal pregnancy -immune system problems -IUD in place -liver disease or tumor -problems with blood clots or take blood-thinners -use intravenous drugs -uterus of unusual shape -vaginal bleeding that has not been explained -an unusual or allergic reaction to levonorgestrel, other hormones, silicone, or polyethylene, medicines, foods, dyes, or preservatives -pregnant or trying to get pregnant -breast-feeding How should I use this medicine? This device is placed inside the uterus by a health care professional. Talk to your pediatrician regarding the use of this medicine in children. Special care may be needed. Overdosage: If you think you have taken too much of this medicine contact a poison control center or emergency room at once. NOTE: This medicine is only for you. Do not share this medicine with others. What if I miss a dose? This does not apply. What may interact with this medicine? Do not take this medicine with any of the following medications: -amprenavir -bosentan -fosamprenavir This medicine may also interact with  the following medications: -aprepitant -barbiturate medicines for inducing sleep or treating seizures -bexarotene -griseofulvin -medicines to treat seizures like carbamazepine, ethotoin, felbamate, oxcarbazepine, phenytoin, topiramate -modafinil -pioglitazone -rifabutin -rifampin -rifapentine -some medicines to treat HIV infection like atazanavir, indinavir, lopinavir, nelfinavir, tipranavir, ritonavir -St. John's wort -warfarin This list may not describe all possible interactions. Give your health care provider a list of all the medicines, herbs, non-prescription drugs, or dietary supplements you use. Also tell them if you smoke, drink alcohol, or use illegal drugs. Some items may interact with your medicine. What should I watch for while using this medicine? Visit your doctor or health care professional for regular check ups. See your doctor if you or your partner has sexual contact with others, becomes HIV positive, or gets a sexual transmitted disease. This product does not protect you against HIV infection (AIDS) or other sexually transmitted diseases. You can check the placement of the IUD yourself by reaching up to the top of your vagina with clean fingers to feel the threads. Do not pull on the threads. It is a good habit to check placement after each menstrual period. Call your doctor right away if you feel more of the IUD than just the threads or if you cannot feel the threads at all. The IUD may come out by itself. You may become pregnant if the device comes out. If you notice that the IUD has come out use a backup birth control method like condoms and call your health care provider. Using tampons will not change the position of the IUD and are okay to use during your period. What side effects may I notice from receiving this medicine?   Side effects that you should report to your doctor or health care professional as soon as possible: -allergic reactions like skin rash, itching or  hives, swelling of the face, lips, or tongue -fever, flu-like symptoms -genital sores -high blood pressure -no menstrual period for 6 weeks during use -pain, swelling, warmth in the leg -pelvic pain or tenderness -severe or sudden headache -signs of pregnancy -stomach cramping -sudden shortness of breath -trouble with balance, talking, or walking -unusual vaginal bleeding, discharge -yellowing of the eyes or skin Side effects that usually do not require medical attention (report to your doctor or health care professional if they continue or are bothersome): -acne -breast pain -change in sex drive or performance -changes in weight -cramping, dizziness, or faintness while the device is being inserted -headache -irregular menstrual bleeding within first 3 to 6 months of use -nausea This list may not describe all possible side effects. Call your doctor for medical advice about side effects. You may report side effects to FDA at 1-800-FDA-1088. Where should I keep my medicine? This does not apply. NOTE: This sheet is a summary. It may not cover all possible information. If you have questions about this medicine, talk to your doctor, pharmacist, or health care provider.    2016, Elsevier/Gold Standard. (2011-02-02 13:54:04) Contraception Choices Contraception (birth control) is the use of any methods or devices to prevent pregnancy. Below are some methods to help avoid pregnancy. HORMONAL METHODS   Contraceptive implant. This is a thin, plastic tube containing progesterone hormone. It does not contain estrogen hormone. Your health care provider inserts the tube in the inner part of the upper arm. The tube can remain in place for up to 3 years. After 3 years, the implant must be removed. The implant prevents the ovaries from releasing an egg (ovulation), thickens the cervical mucus to prevent sperm from entering the uterus, and thins the lining of the inside of the  uterus.  Progesterone-only injections. These injections are given every 3 months by your health care provider to prevent pregnancy. This synthetic progesterone hormone stops the ovaries from releasing eggs. It also thickens cervical mucus and changes the uterine lining. This makes it harder for sperm to survive in the uterus.  Birth control pills. These pills contain estrogen and progesterone hormone. They work by preventing the ovaries from releasing eggs (ovulation). They also cause the cervical mucus to thicken, preventing the sperm from entering the uterus. Birth control pills are prescribed by a health care provider.Birth control pills can also be used to treat heavy periods.  Minipill. This type of birth control pill contains only the progesterone hormone. They are taken every day of each month and must be prescribed by your health care provider.  Birth control patch. The patch contains hormones similar to those in birth control pills. It must be changed once a week and is prescribed by a health care provider.  Vaginal ring. The ring contains hormones similar to those in birth control pills. It is left in the vagina for 3 weeks, removed for 1 week, and then a new one is put back in place. The patient must be comfortable inserting and removing the ring from the vagina.A health care provider's prescription is necessary.  Emergency contraception. Emergency contraceptives prevent pregnancy after unprotected sexual intercourse. This pill can be taken right after sex or up to 5 days after unprotected sex. It is most effective the sooner you take the pills after having sexual intercourse. Most emergency contraceptive pills are available without   a prescription. Check with your pharmacist. Do not use emergency contraception as your only form of birth control. BARRIER METHODS   Female condom. This is a thin sheath (latex or rubber) that is worn over the penis during sexual intercourse. It can be used with  spermicide to increase effectiveness.  Female condom. This is a soft, loose-fitting sheath that is put into the vagina before sexual intercourse.  Diaphragm. This is a soft, latex, dome-shaped barrier that must be fitted by a health care provider. It is inserted into the vagina, along with a spermicidal jelly. It is inserted before intercourse. The diaphragm should be left in the vagina for 6 to 8 hours after intercourse.  Cervical cap. This is a round, soft, latex or plastic cup that fits over the cervix and must be fitted by a health care provider. The cap can be left in place for up to 48 hours after intercourse.  Sponge. This is a soft, circular piece of polyurethane foam. The sponge has spermicide in it. It is inserted into the vagina after wetting it and before sexual intercourse.  Spermicides. These are chemicals that kill or block sperm from entering the cervix and uterus. They come in the form of creams, jellies, suppositories, foam, or tablets. They do not require a prescription. They are inserted into the vagina with an applicator before having sexual intercourse. The process must be repeated every time you have sexual intercourse. INTRAUTERINE CONTRACEPTION  Intrauterine device (IUD). This is a T-shaped device that is put in a woman's uterus during a menstrual period to prevent pregnancy. There are 2 types:  Copper IUD. This type of IUD is wrapped in copper wire and is placed inside the uterus. Copper makes the uterus and fallopian tubes produce a fluid that kills sperm. It can stay in place for 10 years.  Hormone IUD. This type of IUD contains the hormone progestin (synthetic progesterone). The hormone thickens the cervical mucus and prevents sperm from entering the uterus, and it also thins the uterine lining to prevent implantation of a fertilized egg. The hormone can weaken or kill the sperm that get into the uterus. It can stay in place for 3-5 years, depending on which type of IUD  is used. PERMANENT METHODS OF CONTRACEPTION  Female tubal ligation. This is when the woman's fallopian tubes are surgically sealed, tied, or blocked to prevent the egg from traveling to the uterus.  Hysteroscopic sterilization. This involves placing a small coil or insert into each fallopian tube. Your doctor uses a technique called hysteroscopy to do the procedure. The device causes scar tissue to form. This results in permanent blockage of the fallopian tubes, so the sperm cannot fertilize the egg. It takes about 3 months after the procedure for the tubes to become blocked. You must use another form of birth control for these 3 months.  Female sterilization. This is when the female has the tubes that carry sperm tied off (vasectomy).This blocks sperm from entering the vagina during sexual intercourse. After the procedure, the man can still ejaculate fluid (semen). NATURAL PLANNING METHODS  Natural family planning. This is not having sexual intercourse or using a barrier method (condom, diaphragm, cervical cap) on days the woman could become pregnant.  Calendar method. This is keeping track of the length of each menstrual cycle and identifying when you are fertile.  Ovulation method. This is avoiding sexual intercourse during ovulation.  Symptothermal method. This is avoiding sexual intercourse during ovulation, using a thermometer and ovulation symptoms.    Post-ovulation method. This is timing sexual intercourse after you have ovulated. Regardless of which type or method of contraception you choose, it is important that you use condoms to protect against the transmission of sexually transmitted infections (STIs). Talk with your health care provider about which form of contraception is most appropriate for you.   This information is not intended to replace advice given to you by your health care provider. Make sure you discuss any questions you have with your health care provider.   Document  Released: 01/02/2005 Document Revised: 01/07/2013 Document Reviewed: 06/27/2012 Elsevier Interactive Patient Education 2016 Elsevier Inc.  

## 2015-09-10 LAB — CBC
HEMATOCRIT: 37.4 % (ref 34.0–46.6)
HEMOGLOBIN: 11.8 g/dL (ref 11.1–15.9)
MCH: 23.9 pg — ABNORMAL LOW (ref 26.6–33.0)
MCHC: 31.6 g/dL (ref 31.5–35.7)
MCV: 76 fL — AB (ref 79–97)
Platelets: 290 10*3/uL (ref 150–379)
RBC: 4.94 x10E6/uL (ref 3.77–5.28)
RDW: 15.6 % — AB (ref 12.3–15.4)
WBC: 11.3 10*3/uL — ABNORMAL HIGH (ref 3.4–10.8)

## 2015-09-10 LAB — LIPID PANEL
CHOLESTEROL TOTAL: 130 mg/dL (ref 100–199)
Chol/HDL Ratio: 3.5 ratio units (ref 0.0–4.4)
HDL: 37 mg/dL — ABNORMAL LOW (ref >39)
LDL CALC: 80 mg/dL (ref 0–99)
TRIGLYCERIDES: 66 mg/dL (ref 0–149)
VLDL CHOLESTEROL CAL: 13 mg/dL (ref 5–40)

## 2015-09-10 LAB — BASIC METABOLIC PANEL
BUN/Creatinine Ratio: 9 (ref 9–23)
BUN: 8 mg/dL (ref 6–20)
CALCIUM: 9.3 mg/dL (ref 8.7–10.2)
CHLORIDE: 101 mmol/L (ref 96–106)
CO2: 26 mmol/L (ref 18–29)
CREATININE: 0.93 mg/dL (ref 0.57–1.00)
GFR, EST AFRICAN AMERICAN: 97 mL/min/{1.73_m2} (ref >59)
GFR, EST NON AFRICAN AMERICAN: 84 mL/min/{1.73_m2} (ref >59)
Glucose: 60 mg/dL — ABNORMAL LOW (ref 65–99)
POTASSIUM: 4.3 mmol/L (ref 3.5–5.2)
Sodium: 141 mmol/L (ref 134–144)

## 2015-09-10 LAB — HEPATITIS B SURFACE ANTIGEN: Hepatitis B Surface Ag: NEGATIVE

## 2015-09-10 LAB — RPR: RPR Ser Ql: NONREACTIVE

## 2015-09-10 LAB — HIV ANTIBODY (ROUTINE TESTING W REFLEX): HIV Screen 4th Generation wRfx: NONREACTIVE

## 2015-09-11 LAB — GC/CHLAMYDIA PROBE AMP
Chlamydia trachomatis, NAA: POSITIVE — AB
Neisseria gonorrhoeae by PCR: NEGATIVE

## 2015-09-12 ENCOUNTER — Other Ambulatory Visit: Payer: Self-pay | Admitting: Obstetrics and Gynecology

## 2015-09-12 LAB — PAP IG W/ RFLX HPV ASCU: PAP Smear Comment: 0

## 2015-09-12 MED ORDER — AZITHROMYCIN 500 MG PO TABS: 1000.0000 mg | ORAL_TABLET | Freq: Once | ORAL | 1 refills | Status: AC

## 2015-09-16 ENCOUNTER — Telehealth: Payer: Self-pay | Admitting: *Deleted

## 2015-09-16 NOTE — Telephone Encounter (Signed)
[facility] po x [facility] [address] [phone].Patient is aware of lab result no sex for a week after medication and needs appointment for TOC in 4-6 weeks.

## 2015-10-07 ENCOUNTER — Ambulatory Visit (INDEPENDENT_AMBULATORY_CARE_PROVIDER_SITE_OTHER): Payer: 59 | Admitting: Family Medicine

## 2015-10-07 ENCOUNTER — Encounter: Payer: Self-pay | Admitting: Family Medicine

## 2015-10-07 VITALS — BP 130/83 | HR 61 | Temp 98.5°F | Wt 247.6 lb

## 2015-10-07 DIAGNOSIS — A749 Chlamydial infection, unspecified: Secondary | ICD-10-CM | POA: Insufficient documentation

## 2015-10-07 DIAGNOSIS — Z30431 Encounter for routine checking of intrauterine contraceptive device: Secondary | ICD-10-CM | POA: Diagnosis not present

## 2015-10-07 MED ORDER — NORGESTIMATE-ETH ESTRADIOL 0.25-35 MG-MCG PO TABS: 1.0000 | ORAL_TABLET | Freq: Every day | ORAL | 1 refills | Status: DC

## 2015-10-07 NOTE — Progress Notes (Signed)
   Subjective:    Patient ID: Morgan Schwartz is a 30 y.o. female presenting with No chief complaint on file.  on 10/07/2015  HPI: Here for IUD check. Placed on 8/24 by Dr. Jolayne Pantheronstant. Having bleeding.  Positive for chlamydia when IUD inserted.  Review of Systems  Constitutional: Negative for chills and fever.  Respiratory: Negative for shortness of breath.   Cardiovascular: Negative for chest pain.  Gastrointestinal: Negative for abdominal pain, nausea and vomiting.  Genitourinary: Negative for dysuria.  Skin: Negative for rash.      Objective:    LMP 08/27/2015 (Exact Date)  Physical Exam  Constitutional: She is oriented to person, place, and time. She appears well-developed and well-nourished. No distress.  HENT:  Head: Normocephalic and atraumatic.  Eyes: No scleral icterus.  Neck: Neck supple.  Cardiovascular: Normal rate.   Pulmonary/Chest: Effort normal.  Abdominal: Soft.  Genitourinary:  Genitourinary Comments: IUD string felt  Neurological: She is alert and oriented to person, place, and time.  Skin: Skin is warm and dry.  Psychiatric: She has a normal mood and affect.        Assessment & Plan:   Problem List Items Addressed This Visit      Unprioritized   Nucleic acid assay by PCR positive for Chlamydia    TOC in 3 months.       Other Visit Diagnoses    IUD check up    -  Primary   Sprintec to help stop bleeding.   Relevant Medications   levonorgestrel (MIRENA) 20 MCG/24HR IUD   norgestimate-ethinyl estradiol (ORTHO-CYCLEN,SPRINTEC,PREVIFEM) 0.25-35 MG-MCG tablet      Return if symptoms worsen or fail to improve.  Reva Boresanya S Pratt 10/07/2015 9:21 AM

## 2015-10-07 NOTE — Assessment & Plan Note (Signed)
TOC in 3 months.

## 2015-10-07 NOTE — Patient Instructions (Signed)
Levonorgestrel intrauterine device (IUD) What is this medicine? LEVONORGESTREL IUD (LEE voe nor jes trel) is a contraceptive (birth control) device. The device is placed inside the uterus by a healthcare professional. It is used to prevent pregnancy and can also be used to treat heavy bleeding that occurs during your period. Depending on the device, it can be used for 3 to 5 years. This medicine may be used for other purposes; ask your health care provider or pharmacist if you have questions. What should I tell my health care provider before I take this medicine? They need to know if you have any of these conditions: -abnormal Pap smear -cancer of the breast, uterus, or cervix -diabetes -endometritis -genital or pelvic infection now or in the past -have more than one sexual partner or your partner has more than one partner -heart disease -history of an ectopic or tubal pregnancy -immune system problems -IUD in place -liver disease or tumor -problems with blood clots or take blood-thinners -use intravenous drugs -uterus of unusual shape -vaginal bleeding that has not been explained -an unusual or allergic reaction to levonorgestrel, other hormones, silicone, or polyethylene, medicines, foods, dyes, or preservatives -pregnant or trying to get pregnant -breast-feeding How should I use this medicine? This device is placed inside the uterus by a health care professional. Talk to your pediatrician regarding the use of this medicine in children. Special care may be needed. Overdosage: If you think you have taken too much of this medicine contact a poison control center or emergency room at once. NOTE: This medicine is only for you. Do not share this medicine with others. What if I miss a dose? This does not apply. What may interact with this medicine? Do not take this medicine with any of the following medications: -amprenavir -bosentan -fosamprenavir This medicine may also interact with  the following medications: -aprepitant -barbiturate medicines for inducing sleep or treating seizures -bexarotene -griseofulvin -medicines to treat seizures like carbamazepine, ethotoin, felbamate, oxcarbazepine, phenytoin, topiramate -modafinil -pioglitazone -rifabutin -rifampin -rifapentine -some medicines to treat HIV infection like atazanavir, indinavir, lopinavir, nelfinavir, tipranavir, ritonavir -St. John's wort -warfarin This list may not describe all possible interactions. Give your health care provider a list of all the medicines, herbs, non-prescription drugs, or dietary supplements you use. Also tell them if you smoke, drink alcohol, or use illegal drugs. Some items may interact with your medicine. What should I watch for while using this medicine? Visit your doctor or health care professional for regular check ups. See your doctor if you or your partner has sexual contact with others, becomes HIV positive, or gets a sexual transmitted disease. This product does not protect you against HIV infection (AIDS) or other sexually transmitted diseases. You can check the placement of the IUD yourself by reaching up to the top of your vagina with clean fingers to feel the threads. Do not pull on the threads. It is a good habit to check placement after each menstrual period. Call your doctor right away if you feel more of the IUD than just the threads or if you cannot feel the threads at all. The IUD may come out by itself. You may become pregnant if the device comes out. If you notice that the IUD has come out use a backup birth control method like condoms and call your health care provider. Using tampons will not change the position of the IUD and are okay to use during your period. What side effects may I notice from receiving this medicine?   Side effects that you should report to your doctor or health care professional as soon as possible: -allergic reactions like skin rash, itching or  hives, swelling of the face, lips, or tongue -fever, flu-like symptoms -genital sores -high blood pressure -no menstrual period for 6 weeks during use -pain, swelling, warmth in the leg -pelvic pain or tenderness -severe or sudden headache -signs of pregnancy -stomach cramping -sudden shortness of breath -trouble with balance, talking, or walking -unusual vaginal bleeding, discharge -yellowing of the eyes or skin Side effects that usually do not require medical attention (report to your doctor or health care professional if they continue or are bothersome): -acne -breast pain -change in sex drive or performance -changes in weight -cramping, dizziness, or faintness while the device is being inserted -headache -irregular menstrual bleeding within first 3 to 6 months of use -nausea This list may not describe all possible side effects. Call your doctor for medical advice about side effects. You may report side effects to FDA at 1-800-FDA-1088. Where should I keep my medicine? This does not apply. NOTE: This sheet is a summary. It may not cover all possible information. If you have questions about this medicine, talk to your doctor, pharmacist, or health care provider.    2016, Elsevier/Gold Standard. (2011-02-02 13:54:04)  

## 2016-02-29 ENCOUNTER — Encounter: Payer: Self-pay | Admitting: Emergency Medicine

## 2016-02-29 ENCOUNTER — Emergency Department
Admission: EM | Admit: 2016-02-29 | Discharge: 2016-02-29 | Disposition: A | Discharge status: Home / Self Care | Payer: 59 | Attending: Emergency Medicine | Admitting: Emergency Medicine

## 2016-02-29 DIAGNOSIS — Z79899 Other long term (current) drug therapy: Secondary | ICD-10-CM | POA: Diagnosis not present

## 2016-02-29 DIAGNOSIS — Z791 Long term (current) use of non-steroidal anti-inflammatories (NSAID): Secondary | ICD-10-CM | POA: Insufficient documentation

## 2016-02-29 DIAGNOSIS — R509 Fever, unspecified: Secondary | ICD-10-CM | POA: Diagnosis present

## 2016-02-29 DIAGNOSIS — J111 Influenza due to unidentified influenza virus with other respiratory manifestations: Secondary | ICD-10-CM | POA: Insufficient documentation

## 2016-02-29 MED ORDER — ACETAMINOPHEN 325 MG PO TABS
650.0000 mg | ORAL_TABLET | Freq: Once | ORAL | Status: AC | PRN
  Administered 2016-02-29: 650 mg via ORAL

## 2016-02-29 MED ORDER — ACETAMINOPHEN 325 MG PO TABS
ORAL_TABLET | ORAL | Status: AC
  Filled 2016-02-29: qty 2

## 2016-02-29 MED ORDER — KETOROLAC TROMETHAMINE 60 MG/2ML IM SOLN
30.0000 mg | Freq: Once | INTRAMUSCULAR | Status: AC
  Administered 2016-02-29: 30 mg via INTRAMUSCULAR
  Filled 2016-02-29: qty 2

## 2016-02-29 MED ORDER — OSELTAMIVIR PHOSPHATE 75 MG PO CAPS: 75.0000 mg | ORAL_CAPSULE | Freq: Two times a day (BID) | ORAL | 0 refills | Status: AC

## 2016-02-29 NOTE — ED Triage Notes (Signed)
Fever and body aches started last night.

## 2016-02-29 NOTE — ED Notes (Signed)
Pt reports that she is aching all over and having severe headaches - both her daughters were in the ED last night and both tested positive for the flu

## 2016-02-29 NOTE — ED Provider Notes (Signed)
[facility] Emergency Department Provider Note  ____________________________________________  Time seen: Approximately 8:07 PM  I have reviewed the triage vital signs and the nursing notes.   HISTORY  Chief Complaint Fever and Generalized Body Aches    HPI Morgan Schwartz is a 31 y.o. female since the emergency department with body aches, fever, headache, nonproductive cough, diarrhea since this morning. Patient's daughter tested positive for the flu last night. Patient is eating and drinking well. Patient denies change in urination.Patient has not taken anything for symptoms. Patient did not receive flu shot this year. No shortness of breath, chest pain, nausea, vomiting, abdominal pain, constipation.   Past Medical History:  Diagnosis Date  . Abnormal Pap smear   . Anemia   . Late prenatal care   . Obesity   . Vaginal Pap smear, abnormal     Patient Active Problem List   Diagnosis Date Noted  . Nucleic acid assay by PCR positive for Chlamydia 10/07/2015  . LLQ abdominal pain 10/08/2011    Past Surgical History:  Procedure Laterality Date  . NO PAST SURGERIES      Prior to Admission medications   Medication Sig Start Date End Date Taking? Authorizing Provider  acetaminophen (TYLENOL) 500 MG tablet Take 1,000 mg by mouth every 6 (six) hours as needed for mild pain.    Historical Provider, MD  ibuprofen (ADVIL,MOTRIN) 400 MG tablet Take 400 mg by mouth every 6 (six) hours as needed.    Historical Provider, MD  levonorgestrel (MIRENA) 20 MCG/24HR IUD 1 each by Intrauterine route once.    Historical Provider, MD  norgestimate-ethinyl estradiol (ORTHO-CYCLEN,SPRINTEC,PREVIFEM) 0.25-35 MG-MCG tablet Take 1 tablet by mouth daily. 10/07/15   Reva Boresanya S Pratt, MD    Allergies Patient has no known allergies.  Family History  Problem Relation Age of Onset  . Cancer Paternal Grandfather   . Alcohol abuse Neg Hx     Social History Social History   Substance Use Topics  . Smoking status: Never Smoker  . Smokeless tobacco: Never Used  . Alcohol use No     Review of Systems  Eyes: No visual changes. No discharge. ENT: Negative for rhinorrhea. Cardiovascular: No chest pain. Respiratory: Positive for cough. No SOB. Gastrointestinal: No abdominal pain.  No nausea, no vomiting.  No constipation. Musculoskeletal: Positive for musculoskeletal pain. Skin: Negative for rash, abrasions, lacerations, ecchymosis. Neurological: Positive for headaches.   ____________________________________________   PHYSICAL EXAM:  VITAL SIGNS: ED Triage Vitals [02/29/16 1814]  Enc Vitals Group     BP 132/79     Pulse Rate (!) 109     Resp 20     Temp 100 F (37.8 C)     Temp Source Oral     SpO2 97 %     Weight 230 lb (104.3 kg)     Height 5\' 10"  (1.778 m)     Head Circumference      Peak Flow      Pain Score 10     Pain Loc      Pain Edu?      Excl. in GC?      Constitutional: Alert and oriented. Well appearing and in no acute distress. Eyes: Conjunctivae are normal. PERRL. EOMI. No discharge. Head: Atraumatic. ENT: No frontal and maxillary sinus tenderness.      Ears: Tympanic membranes pearly gray with good landmarks. No discharge.      Nose: Mild congestion/rhinnorhea.      Mouth/Throat: Mucous membranes are moist.  Oropharynx non-erythematous. Tonsils not enlarged. No exudates. Uvula midline. Neck: No stridor.   Hematological/Lymphatic/Immunilogical: No cervical lymphadenopathy. Cardiovascular: Normal rate, regular rhythm.  Good peripheral circulation. Respiratory: Normal respiratory effort without tachypnea or retractions. Lungs CTAB. Good air entry to the bases with no decreased or absent breath sounds. Gastrointestinal: Bowel sounds 4 quadrants. Soft and nontender to palpation. No guarding or rigidity. No palpable masses. No distention. Musculoskeletal: Full range of motion to all extremities. No gross deformities  appreciated. Neurologic:  Normal speech and language. No gross focal neurologic deficits are appreciated.  Skin:  Skin is warm, dry and intact. No rash noted. Psychiatric: Mood and affect are normal. Speech and behavior are normal. Patient exhibits appropriate insight and judgement.   ____________________________________________   LABS (all labs ordered are listed, but only abnormal results are displayed)  Labs Reviewed - No data to display ____________________________________________  EKG   ____________________________________________  RADIOLOGY  No results found.  ____________________________________________    PROCEDURES  Procedure(s) performed:    Procedures    Medications  acetaminophen (TYLENOL) 325 MG tablet (not administered)  ketorolac (TORADOL) injection 30 mg (not administered)  acetaminophen (TYLENOL) tablet 650 mg (650 mg Oral Given 02/29/16 1818)     ____________________________________________   INITIAL IMPRESSION / ASSESSMENT AND PLAN / ED COURSE  Pertinent labs & imaging results that were available during my care of the patient were reviewed by me and considered in my medical decision making (see chart for details).  Review of the [place] CSRS was performed in accordance of the [facility] prior to dispensing any controlled drugs.     Patient's diagnosis is consistent with influenza. Vital signs and exam are reassuring. She appears well in ED. Education was provided. All questions were answered. Patient was exposed to influenza. She is within the window to begin Tamiflu. Patient will be discharged home with prescriptions for Tamiflu. Patient is to follow up with PCP as needed or otherwise directed. Patient is given ED precautions to return to the ED for any worsening or new symptoms.     ____________________________________________  FINAL CLINICAL IMPRESSION(S) / ED DIAGNOSES  Final diagnoses:  None      NEW MEDICATIONS STARTED DURING THIS  VISIT:  New Prescriptions   No medications on file        This chart was dictated using voice recognition software/Dragon. Despite best efforts to proofread, errors can occur which can change the meaning. Any change was purely unintentional.    Enid Derry, PA-C 03/01/16 0026    Minna Antis, MD 03/06/16 2028

## 2016-12-20 ENCOUNTER — Other Ambulatory Visit: Payer: Self-pay

## 2016-12-20 DIAGNOSIS — Z30431 Encounter for routine checking of intrauterine contraceptive device: Secondary | ICD-10-CM

## 2016-12-20 NOTE — Telephone Encounter (Signed)
Pt called requesting refill on birth control pills to help control bleeding with mirena. She states that she sometimes have cycles that are lasting longer than 7-10 days. Pt has not been seen for an annual since last year.

## 2016-12-21 NOTE — Telephone Encounter (Signed)
Pt informed. Pt will callback to schedule appt

## 2016-12-21 NOTE — Telephone Encounter (Signed)
Left message for pt to return call.

## 2017-01-04 ENCOUNTER — Other Ambulatory Visit: Payer: Self-pay | Admitting: Family Medicine

## 2017-01-04 DIAGNOSIS — Z30431 Encounter for routine checking of intrauterine contraceptive device: Secondary | ICD-10-CM

## 2017-01-10 NOTE — Telephone Encounter (Signed)
LMTCB

## 2017-01-22 NOTE — Telephone Encounter (Signed)
Pt informed. Pt does still have her IUD. She will call office to schedule appt

## 2017-04-01 ENCOUNTER — Emergency Department: Payer: 59

## 2017-04-01 ENCOUNTER — Other Ambulatory Visit: Payer: Self-pay

## 2017-04-01 ENCOUNTER — Encounter: Payer: Self-pay | Admitting: Medical Oncology

## 2017-04-01 ENCOUNTER — Encounter (HOSPITAL_COMMUNITY): Payer: Self-pay | Admitting: *Deleted

## 2017-04-01 ENCOUNTER — Emergency Department
Admission: EM | Admit: 2017-04-01 | Discharge: 2017-04-01 | Disposition: A | Discharge status: Home / Self Care | Payer: 59 | Attending: Emergency Medicine | Admitting: Emergency Medicine

## 2017-04-01 ENCOUNTER — Emergency Department (HOSPITAL_COMMUNITY)
Admission: EM | Admit: 2017-04-01 | Discharge: 2017-04-01 | Disposition: A | Discharge status: Home / Self Care | Payer: 59 | Attending: Emergency Medicine | Admitting: Emergency Medicine

## 2017-04-01 DIAGNOSIS — R51 Headache: Secondary | ICD-10-CM | POA: Insufficient documentation

## 2017-04-01 DIAGNOSIS — M62838 Other muscle spasm: Secondary | ICD-10-CM

## 2017-04-01 DIAGNOSIS — M549 Dorsalgia, unspecified: Secondary | ICD-10-CM | POA: Diagnosis present

## 2017-04-01 DIAGNOSIS — Z5321 Procedure and treatment not carried out due to patient leaving prior to being seen by health care provider: Secondary | ICD-10-CM | POA: Diagnosis not present

## 2017-04-01 DIAGNOSIS — M542 Cervicalgia: Secondary | ICD-10-CM | POA: Diagnosis not present

## 2017-04-01 DIAGNOSIS — M62830 Muscle spasm of back: Secondary | ICD-10-CM | POA: Insufficient documentation

## 2017-04-01 LAB — URINALYSIS, COMPLETE (UACMP) WITH MICROSCOPIC
Bilirubin Urine: NEGATIVE
Glucose, UA: NEGATIVE mg/dL
Hgb urine dipstick: NEGATIVE
Ketones, ur: NEGATIVE mg/dL
Nitrite: NEGATIVE
Protein, ur: 30 mg/dL — AB
Specific Gravity, Urine: 1.026 (ref 1.005–1.030)
pH: 6 (ref 5.0–8.0)

## 2017-04-01 LAB — POCT PREGNANCY, URINE: Preg Test, Ur: NEGATIVE

## 2017-04-01 MED ORDER — METHOCARBAMOL 500 MG PO TABS
1000.0000 mg | ORAL_TABLET | Freq: Once | ORAL | Status: AC
  Administered 2017-04-01: 1000 mg via ORAL
  Filled 2017-04-01: qty 2

## 2017-04-01 MED ORDER — METHOCARBAMOL 500 MG PO TABS: 1000.0000 mg | ORAL_TABLET | Freq: Three times a day (TID) | ORAL | 0 refills | Status: AC

## 2017-04-01 MED ORDER — KETOROLAC TROMETHAMINE 30 MG/ML IJ SOLN
30.0000 mg | Freq: Once | INTRAMUSCULAR | Status: AC
  Administered 2017-04-01: 30 mg via INTRAMUSCULAR
  Filled 2017-04-01: qty 1
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
## 2017-04-01 NOTE — ED Notes (Signed)
Pt's name called for a room x4.  No response.
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
## 2017-06-14 ENCOUNTER — Ambulatory Visit: Payer: 59 | Admitting: Obstetrics and Gynecology

## 2017-06-19 ENCOUNTER — Ambulatory Visit: Payer: 59 | Admitting: Obstetrics and Gynecology

## 2017-06-28 ENCOUNTER — Encounter: Payer: Self-pay | Admitting: Obstetrics and Gynecology

## 2017-06-28 ENCOUNTER — Ambulatory Visit (INDEPENDENT_AMBULATORY_CARE_PROVIDER_SITE_OTHER): Payer: 59 | Admitting: Obstetrics and Gynecology

## 2017-06-28 VITALS — BP 134/90 | HR 91 | Ht 70.0 in | Wt 242.8 lb

## 2017-06-28 DIAGNOSIS — R8290 Unspecified abnormal findings in urine: Secondary | ICD-10-CM | POA: Diagnosis not present

## 2017-06-28 DIAGNOSIS — N926 Irregular menstruation, unspecified: Secondary | ICD-10-CM | POA: Diagnosis not present

## 2017-06-28 DIAGNOSIS — Z3202 Encounter for pregnancy test, result negative: Secondary | ICD-10-CM

## 2017-06-28 DIAGNOSIS — N3000 Acute cystitis without hematuria: Secondary | ICD-10-CM

## 2017-06-28 DIAGNOSIS — R102 Pelvic and perineal pain: Secondary | ICD-10-CM

## 2017-06-28 LAB — POCT URINALYSIS DIPSTICK
Bilirubin, UA: NEGATIVE
GLUCOSE UA: NEGATIVE
KETONES UA: NEGATIVE
Nitrite, UA: POSITIVE
Protein, UA: NEGATIVE
RBC UA: NEGATIVE
SPEC GRAV UA: 1.015 (ref 1.010–1.025)
Urobilinogen, UA: 0.2 E.U./dL
pH, UA: 7 (ref 5.0–8.0)

## 2017-06-28 LAB — POCT URINE PREGNANCY: PREG TEST UR: NEGATIVE

## 2017-06-28 MED ORDER — NITROFURANTOIN MONOHYD MACRO 100 MG PO CAPS: 100.0000 mg | ORAL_CAPSULE | Freq: Two times a day (BID) | ORAL | 1 refills | Status: DC

## 2017-06-28 NOTE — Progress Notes (Signed)
Pt presents for annual, pap, and vaginal STD testing only. Pt c/o intermittent cramping, irregular periods, and mood swings x 2 months. Mirena in place since 09/09/15. Has hx of conceiving while on Mirena per pt. UPT neg today. Positive Nitrates

## 2017-06-28 NOTE — Progress Notes (Signed)
GYNECOLOGY OFFICE VISIT NOTE  History:  32 y.o. [id number] here today for IUD issues, had Mirena placed 08/2015. First 1.5 years, states she had no issues, had regular cycles, no cramping. The last two months, she is having cramping and no bleeding, has not had period since 5/10, states she has had cramping for 2 weeks with no bleeding.   Past Medical History:  Diagnosis Date  . Abnormal Pap smear   . Anemia   . Late prenatal care   . Obesity   . Vaginal Pap smear, abnormal     Past Surgical History:  Procedure Laterality Date  . NO PAST SURGERIES       Current Outpatient Medications:  .  levonorgestrel (MIRENA) 20 MCG/24HR IUD, 1 each by Intrauterine route once., Disp: , Rfl:  .  acetaminophen (TYLENOL) 500 MG tablet, Take 1,000 mg by mouth every 6 (six) hours as needed for mild pain., Disp: , Rfl:  .  ESTARYLLA 0.25-35 MG-MCG tablet, TAKE 1 TABLET BY MOUTH DAILY (Patient not taking: Reported on 06/28/2017), Disp: 28 tablet, Rfl: 0 .  ibuprofen (ADVIL,MOTRIN) 400 MG tablet, Take 400 mg by mouth every 6 (six) hours as needed., Disp: , Rfl:  .  nitrofurantoin, macrocrystal-monohydrate, (MACROBID) 100 MG capsule, Take 1 capsule (100 mg total) by mouth 2 (two) times daily., Disp: 14 capsule, Rfl: 1  The following portions of the patient's history were reviewed and updated as appropriate: allergies, current medications, past family history, past medical history, past social history, past surgical history and problem list.    Review of Systems:  Pertinent items noted in HPI and remainder of comprehensive ROS otherwise negative.   Objective:  Physical Exam BP 134/90   Pulse 91   Ht 5\' 10"  (1.778 m)   Wt 242 lb 12.8 oz (110.1 kg)   LMP 05/24/2017 (Approximate)   BMI 34.84 kg/m  CONSTITUTIONAL: Well-developed, well-nourished female in no acute distress.  HENT:  Normocephalic, atraumatic. External right and left ear normal. Oropharynx is clear and moist EYES: Conjunctivae and EOM  are normal. Pupils are equal, round, and reactive to light. No scleral icterus.  NECK: Normal range of motion, supple, no masses SKIN: Skin is warm and dry. No rash noted. Not diaphoretic. No erythema. No pallor. NEUROLOGIC: Alert and oriented to person, place, and time. Normal reflexes, muscle tone coordination. No cranial nerve deficit noted. PSYCHIATRIC: Normal mood and affect. Normal behavior. Normal judgment and thought content. CARDIOVASCULAR: Normal heart rate noted RESPIRATORY: Effort normal, no problems with respiration noted ABDOMEN: Soft, no distention noted.   PELVIC: normal appearing external genitalia, normal appearing vaginal mucosa, normal appearing cervix with blue IUD strings protruding from os  Bimanual: no abnormal uterine pain or masses/tenderness MUSCULOSKELETAL: Normal range of motion. No edema noted.  Labs and Imaging No results found.  Assessment & Plan:  1. Irregular menses Neg UPT - POCT urine pregnancy Reviewed that some lightening of periods is to be expected the longer the IUD is in and a late period by a few days is not of acute concern, reviewed that other things can cause mood swings and irregular periods such as stress, patient does report being under a lot of stress - nothing concerning on exam for IUD being misplaced, reviewed that if her symptoms continue or if camping worsens, will review IUD placement with ultrasound, she is agreeable to plan  2. Pelvic cramping See above  3. Acute cystitis without hematuria UTI may be contributing to pelvic cramping macrobid sent to  pharmacy  4. Abnormal urine odor - POCT Urinalysis Dipstick - Urine Culture  Routine preventative health maintenance measures emphasized. Please refer to After Visit Summary for other counseling recommendations.   Return in about 3 months (around 09/28/2017) for annual.    Baldemar Lenis, M.D. Attending Obstetrician & Gynecologist, [facility] for [employer], [facility]

## 2017-06-30 LAB — URINE CULTURE

## 2017-06-30 MED ORDER — CEPHALEXIN 500 MG PO CAPS: 500.0000 mg | ORAL_CAPSULE | Freq: Four times a day (QID) | ORAL | 0 refills | Status: AC

## 2017-06-30 NOTE — Addendum Note (Signed)
Addended by: Leroy LibmanAVIS, Zavien Clubb on: 06/30/2017 11:53 PM   Modules accepted: Orders

## 2017-07-11 ENCOUNTER — Ambulatory Visit: Payer: 59 | Admitting: Obstetrics and Gynecology

## 2017-07-28 ENCOUNTER — Other Ambulatory Visit: Payer: Self-pay

## 2017-07-28 ENCOUNTER — Emergency Department (HOSPITAL_COMMUNITY)
Admission: EM | Admit: 2017-07-28 | Discharge: 2017-07-28 | Disposition: A | Discharge status: Home / Self Care | Payer: 59 | Attending: Emergency Medicine | Admitting: Emergency Medicine

## 2017-07-28 ENCOUNTER — Encounter (HOSPITAL_COMMUNITY): Payer: Self-pay | Admitting: Emergency Medicine

## 2017-07-28 DIAGNOSIS — L299 Pruritus, unspecified: Secondary | ICD-10-CM | POA: Diagnosis not present

## 2017-07-28 DIAGNOSIS — B86 Scabies: Secondary | ICD-10-CM | POA: Diagnosis not present

## 2017-07-28 DIAGNOSIS — R21 Rash and other nonspecific skin eruption: Secondary | ICD-10-CM | POA: Diagnosis present

## 2017-07-28 MED ORDER — PERMETHRIN 5 % EX CREA: TOPICAL_CREAM | CUTANEOUS | 1 refills | Status: DC

## 2017-07-28 NOTE — ED Provider Notes (Signed)
[facility] EMERGENCY DEPARTMENT Provider Note   CSN: [id number] Arrival date & time: 07/28/17  1623     History   Chief Complaint Chief Complaint  Patient presents with  . Rash    HPI Morgan Schwartz is a 32 y.o. female.  Patient with red, itchy rash to both hands and arms x 1 week.  Has tried cortisone cream and oil without relief.  Has had contact with friend who has scabies.  No fevers.  No other symptoms.  The history is provided by the patient. No language interpreter was used.  Rash   This is a new problem. The current episode started more than 2 days ago. The problem has not changed since onset.The problem is associated with an unknown factor. There has been no fever. The rash is present on the left arm and right arm. The patient is experiencing no pain. Associated symptoms include itching. She has tried steriods for the symptoms. The treatment provided no relief.    Past Medical History:  Diagnosis Date  . Abnormal Pap smear   . Anemia   . Late prenatal care   . Obesity   . Vaginal Pap smear, abnormal     Patient Active Problem List   Diagnosis Date Noted  . Nucleic acid assay by PCR positive for Chlamydia 10/07/2015  . LLQ abdominal pain 10/08/2011    Past Surgical History:  Procedure Laterality Date  . NO PAST SURGERIES       OB History    Gravida  6   Para  3   Term  3   Preterm      AB  3   Living  3     SAB      TAB  3   Ectopic      Multiple      Live Births  3            Home Medications    Prior to Admission medications   Medication Sig Start Date End Date Taking? Authorizing Provider  acetaminophen (TYLENOL) 500 MG tablet Take 1,000 mg by mouth every 6 (six) hours as needed for mild pain.    [provider]  ESTARYLLA 0.25-35 MG-MCG tablet TAKE 1 TABLET BY MOUTH DAILY Patient not taking: Reported on 06/28/2017 01/05/17   Reva Bores, MD  ibuprofen (ADVIL,MOTRIN) 400 MG tablet Take 400 mg by  mouth every 6 (six) hours as needed.    [provider]  levonorgestrel (MIRENA) 20 MCG/24HR IUD 1 each by Intrauterine route once.    [provider]  nitrofurantoin, macrocrystal-monohydrate, (MACROBID) 100 MG capsule Take 1 capsule (100 mg total) by mouth 2 (two) times daily. 06/28/17   Conan Bowens, MD  permethrin (ELIMITE) 5 % cream Apply to affected area and leave on for 8-10 hours then shower. If no improvement, may repeat in 1 week. 07/28/17   Lowanda Foster, NP    Family History Family History  Problem Relation Age of Onset  . Cancer Paternal Grandfather   . Alcohol abuse Neg Hx     Social History Social History   Tobacco Use  . Smoking status: Never Smoker  . Smokeless tobacco: Never Used  Substance Use Topics  . Alcohol use: No  . Drug use: No     Allergies   Patient has no known allergies.   Review of Systems Review of Systems  Skin: Positive for itching and rash.  All other systems reviewed and are negative.  Physical Exam Updated Vital Signs Pulse 77   Resp 20   LMP 07/13/2017 (Exact Date)   Physical Exam  Constitutional: She is oriented to person, place, and time. Vital signs are normal. She appears well-developed and well-nourished. She is active and cooperative.  Non-toxic appearance. No distress.  HENT:  Head: Normocephalic and atraumatic.  Right Ear: Tympanic membrane, external ear and ear canal normal.  Left Ear: Tympanic membrane, external ear and ear canal normal.  Nose: Nose normal.  Mouth/Throat: Uvula is midline, oropharynx is clear and moist and mucous membranes are normal.  Eyes: Pupils are equal, round, and reactive to light. EOM are normal.  Neck: Trachea normal and normal range of motion. Neck supple.  Cardiovascular: Normal rate, regular rhythm, normal heart sounds, intact distal pulses and normal pulses.  Pulmonary/Chest: Effort normal and breath sounds normal. No respiratory distress.  Abdominal: Soft. Normal  appearance and bowel sounds are normal. She exhibits no distension and no mass. There is no hepatosplenomegaly. There is no tenderness.  Musculoskeletal: Normal range of motion.  Neurological: She is alert and oriented to person, place, and time. She has normal strength. No cranial nerve deficit or sensory deficit. Coordination normal.  Skin: Skin is warm, dry and intact. Rash noted.  Psychiatric: She has a normal mood and affect. Her behavior is normal. Judgment and thought content normal.  Nursing note and vitals reviewed.    ED Treatments / Results  Labs (all labs ordered are listed, but only abnormal results are displayed) Labs Reviewed - No data to display  EKG None  Radiology No results found.  Procedures Procedures (including critical care time)  Medications Ordered in ED Medications - No data to display   Initial Impression / Assessment and Plan / ED Course  I have reviewed the triage vital signs and the nursing notes.  Pertinent labs & imaging results that were available during my care of the patient were reviewed by me and considered in my medical decision making (see chart for details).     31y female with red, itchy rash x 1 week.  On exam, red linear rash to bilateral arms with papular lesions between fingers of hands.  Likely scabies.  Will d/c home with Rx for Permethrin.  Strict return precautions provided.  Final Clinical Impressions(s) / ED Diagnoses   Final diagnoses:  Scabies    ED Discharge Orders        Ordered    permethrin (ELIMITE) 5 % cream     07/28/17 1634       Lowanda FosterBrewer, Shuan Statzer, NP 07/28/17 1711    Vicki Malletalder, Jennifer K, MD 07/30/17 [phone]

## 2017-07-28 NOTE — Discharge Instructions (Addendum)
Follow up with your doctor for persistent symptoms.  Return to ED for worsening in any way. °

## 2017-07-28 NOTE — ED Triage Notes (Signed)
Pt has had a rash for a week. She states it is itching and it has spread. There are actually 2 different types of skin disorder noticed by this nurse. One on the upper left arm and another that appears to be scabies all over.

## 2018-07-30 ENCOUNTER — Encounter: Payer: Self-pay | Admitting: Certified Nurse Midwife

## 2018-07-30 ENCOUNTER — Ambulatory Visit (INDEPENDENT_AMBULATORY_CARE_PROVIDER_SITE_OTHER): Payer: 59 | Admitting: Certified Nurse Midwife

## 2018-07-30 ENCOUNTER — Other Ambulatory Visit: Payer: Self-pay

## 2018-07-30 VITALS — BP 138/88 | HR 61 | Ht 70.0 in | Wt 187.6 lb

## 2018-07-30 DIAGNOSIS — B373 Candidiasis of vulva and vagina: Secondary | ICD-10-CM | POA: Diagnosis not present

## 2018-07-30 DIAGNOSIS — N760 Acute vaginitis: Secondary | ICD-10-CM | POA: Diagnosis not present

## 2018-07-30 DIAGNOSIS — B9689 Other specified bacterial agents as the cause of diseases classified elsewhere: Secondary | ICD-10-CM | POA: Diagnosis not present

## 2018-07-30 DIAGNOSIS — Z1151 Encounter for screening for human papillomavirus (HPV): Secondary | ICD-10-CM

## 2018-07-30 DIAGNOSIS — Z113 Encounter for screening for infections with a predominantly sexual mode of transmission: Secondary | ICD-10-CM | POA: Diagnosis not present

## 2018-07-30 DIAGNOSIS — Z01419 Encounter for gynecological examination (general) (routine) without abnormal findings: Secondary | ICD-10-CM | POA: Diagnosis not present

## 2018-07-30 DIAGNOSIS — N898 Other specified noninflammatory disorders of vagina: Secondary | ICD-10-CM

## 2018-07-30 DIAGNOSIS — Z124 Encounter for screening for malignant neoplasm of cervix: Secondary | ICD-10-CM

## 2018-07-30 DIAGNOSIS — L700 Acne vulgaris: Secondary | ICD-10-CM | POA: Insufficient documentation

## 2018-07-30 MED ORDER — DESOGESTREL-ETHINYL ESTRADIOL 0.15-30 MG-MCG PO TABS: 1.0000 | ORAL_TABLET | Freq: Every day | ORAL | 1 refills | Status: DC

## 2018-07-30 NOTE — Progress Notes (Signed)
Pt presents for AEX. Pt last pap 2017. Pt has no concerns today. Pt desires to have STD screening.

## 2018-07-30 NOTE — Progress Notes (Signed)
Gynecology Annual Exam   History of Present Illness: Patient is a 33 y.o. [id number] presents for annual exam. The patient has complaints of acne with menses each month. Has tried OCPs in the past and noticed improvement, would like refill. The patient is sexually active. She denies dyspareunia. The patient does not perform self breast exams. There is no notable family history of breast or ovarian cancer in her family. The patient denies current symptoms of depression.    Past Medical History:  Past Medical History:  Diagnosis Date  . Abnormal Pap smear   . Anemia   . Late prenatal care   . Obesity   . Vaginal Pap smear, abnormal     Past Surgical History:  Past Surgical History:  Procedure Laterality Date  . NO PAST SURGERIES      Gynecologic History:  LMP: Patient's last menstrual period was 07/20/2018. Average Interval: regular Heavy Menses: no Clots: no Intermenstrual Bleeding: no Postcoital Bleeding: no Dysmenorrhea: no Contraception: IUD- Mirena Last Pap: completed on 09/09/15 ; result was: no abnormalities   Obstetric History: [id number]  Family History:  Family History  Problem Relation Age of Onset  . Cancer Paternal Grandfather   . Alcohol abuse Neg Hx     Social History:  Social History   Socioeconomic History  . Marital status: Single    Spouse name: Not on file  . Number of children: Not on file  . Years of education: Not on file  . Highest education level: Not on file  Occupational History  . Not on file  Social Needs  . Financial resource strain: Not on file  . Food insecurity    Worry: Not on file    Inability: Not on file  . Transportation needs    Medical: Not on file    Non-medical: Not on file  Tobacco Use  . Smoking status: Never Smoker  . Smokeless tobacco: Never Used  Substance and Sexual Activity  . Alcohol use: No  . Drug use: No  . Sexual activity: Yes    Partners: Male    Birth control/protection: I.U.D.  Lifestyle  . Physical  activity    Days per week: Not on file    Minutes per session: Not on file  . Stress: Not on file  Relationships  . Social Musicianconnections    Talks on phone: Not on file    Gets together: Not on file    Attends religious service: Not on file    Active member of club or organization: Not on file    Attends meetings of clubs or organizations: Not on file    Relationship status: Not on file  . Intimate partner violence    Fear of current or ex partner: Not on file    Emotionally abused: Not on file    Physically abused: Not on file    Forced sexual activity: Not on file  Other Topics Concern  . Not on file  Social History Narrative  . Not on file    Allergies:  No Known Allergies  Medications: Prior to Admission medications   Medication Sig Start Date End Date Taking? Authorizing Provider  acetaminophen (TYLENOL) 500 MG tablet Take 1,000 mg by mouth every 6 (six) hours as needed for mild pain.   Yes [provider]  ibuprofen (ADVIL,MOTRIN) 400 MG tablet Take 400 mg by mouth every 6 (six) hours as needed.   Yes [provider]  levonorgestrel (MIRENA) 20 MCG/24HR IUD 1 each by Intrauterine  route once.   Yes [provider]  Raye Sorrow 0.25-35 MG-MCG tablet TAKE 1 TABLET BY MOUTH DAILY Patient not taking: Reported on 06/28/2017 01/05/17   Donnamae Jude, MD    Review of Systems: negative except noted in HPI  Physical Exam Vitals: BP 138/88   Pulse 61   Ht 5\' 10"  (1.778 m)   Wt 187 lb 9.6 oz (85.1 kg)   LMP 07/20/2018   BMI 26.92 kg/m  General: NAD HEENT: normocephalic, atraumatic Thyroid: no enlargement, no palpable nodules Pulmonary: Normal rate and effort, CTAB Cardiovascular: RRR Breast: Breast symmetrical, no tenderness, no palpable nodules or masses, no skin or nipple retraction present, no nipple discharge. No axillary or supraclavicular lymphadenopathy. Abdomen: soft, non-tender, non-distended. No hepatomegaly, splenomegaly or masses  palpable. No evidence of hernia  Genitourinary:  External: Normal external female genitalia. Normal urethral meatus  Vagina: Normal vaginal mucosa, no evidence of prolapse   Cervix: Grossly normal in appearance, IUD strings seen  Uterus: Non-enlarged, mobile, normal contour, retroverted. No CMT  Adnexa: non-enlarged, no masses  Rectal: deferred Extremities: no edema, erythema, or tenderness Neurologic: Grossly intact Psychiatric: mood appropriate, affect full  Female chaperone present for pelvic and breast portions of the physical exam  Assessment:  1. Well woman exam   2. Acne vulgaris    Plan: Pap and STD screen Rx APRI  for acne- discussed warning signs Follow up with GYN in 1 year or prn Follow up with PCP as scheduled/prn  Julianne Handler, CNM 07/30/2018 3:14 PM

## 2018-08-01 LAB — CERVICOVAGINAL ANCILLARY ONLY
Bacterial vaginitis: POSITIVE — AB
Candida vaginitis: POSITIVE — AB
Chlamydia: NEGATIVE
Neisseria Gonorrhea: NEGATIVE
Trichomonas: POSITIVE — AB

## 2018-08-01 NOTE — Progress Notes (Signed)
Spoke with lab, corrected pap order- they will manually add HPV testing.

## 2018-08-01 NOTE — Addendum Note (Signed)
Addended by: Lewie Loron D on: 08/01/2018 03:25 PM   Modules accepted: Orders

## 2018-08-06 LAB — CYTOLOGY - PAP
Diagnosis: NEGATIVE
HPV: DETECTED — AB

## 2018-08-07 ENCOUNTER — Telehealth: Payer: Self-pay

## 2018-08-07 MED ORDER — METRONIDAZOLE 500 MG PO TABS: ORAL_TABLET | ORAL | 0 refills | Status: AC

## 2018-08-07 MED ORDER — FLUCONAZOLE 150 MG PO TABS: 150.0000 mg | ORAL_TABLET | Freq: Once | ORAL | 0 refills | Status: AC

## 2018-08-07 NOTE — Telephone Encounter (Signed)
S/w pt and advised of results, rx sent and treatment plan 

## 2018-08-14 ENCOUNTER — Telehealth: Payer: Self-pay | Admitting: Certified Nurse Midwife

## 2018-08-14 DIAGNOSIS — L700 Acne vulgaris: Secondary | ICD-10-CM

## 2018-08-14 MED ORDER — DESOGESTREL-ETHINYL ESTRADIOL 0.15-30 MG-MCG PO TABS: 1.0000 | ORAL_TABLET | Freq: Every day | ORAL | 11 refills | Status: DC

## 2018-08-14 NOTE — Telephone Encounter (Signed)
Patient called, she was just seen in the office birth control consult.  She states that her prescription was not at the pharmacy.  Chart indicated Rx for APRI sent in and to follow up in office in one year.  Verified we have the correct pharmacy.  Resent Rx to [facility] with 11 refills.

## 2018-12-03 ENCOUNTER — Encounter: Payer: Self-pay | Admitting: Obstetrics

## 2018-12-03 ENCOUNTER — Other Ambulatory Visit: Payer: Self-pay

## 2018-12-03 ENCOUNTER — Ambulatory Visit (INDEPENDENT_AMBULATORY_CARE_PROVIDER_SITE_OTHER): Payer: 59 | Admitting: Obstetrics

## 2018-12-03 VITALS — BP 118/83 | HR 77 | Ht 70.0 in | Wt 175.0 lb

## 2018-12-03 DIAGNOSIS — Z113 Encounter for screening for infections with a predominantly sexual mode of transmission: Secondary | ICD-10-CM

## 2018-12-03 DIAGNOSIS — N944 Primary dysmenorrhea: Secondary | ICD-10-CM

## 2018-12-03 DIAGNOSIS — Z30011 Encounter for initial prescription of contraceptive pills: Secondary | ICD-10-CM

## 2018-12-03 DIAGNOSIS — L700 Acne vulgaris: Secondary | ICD-10-CM

## 2018-12-03 DIAGNOSIS — Z30432 Encounter for removal of intrauterine contraceptive device: Secondary | ICD-10-CM

## 2018-12-03 DIAGNOSIS — Z3009 Encounter for other general counseling and advice on contraception: Secondary | ICD-10-CM

## 2018-12-03 DIAGNOSIS — B3731 Acute candidiasis of vulva and vagina: Secondary | ICD-10-CM

## 2018-12-03 DIAGNOSIS — B373 Candidiasis of vulva and vagina: Secondary | ICD-10-CM

## 2018-12-03 DIAGNOSIS — Z0000 Encounter for general adult medical examination without abnormal findings: Secondary | ICD-10-CM

## 2018-12-03 MED ORDER — PRENATAL PLUS 27-1 MG PO TABS: 1.0000 | ORAL_TABLET | Freq: Every day | ORAL | 4 refills | Status: AC

## 2018-12-03 MED ORDER — IBUPROFEN 800 MG PO TABS: 800.0000 mg | ORAL_TABLET | Freq: Three times a day (TID) | ORAL | 5 refills | Status: AC | PRN

## 2018-12-03 MED ORDER — FLUCONAZOLE 200 MG PO TABS: 200.0000 mg | ORAL_TABLET | ORAL | 2 refills | Status: AC

## 2018-12-03 MED ORDER — DROSPIRENONE-ETHINYL ESTRADIOL 3-0.02 MG PO TABS: 1.0000 | ORAL_TABLET | Freq: Every day | ORAL | 4 refills | Status: AC

## 2018-12-03 MED ORDER — DROSPIRENONE-ETHINYL ESTRADIOL 3-0.02 MG PO TABS: 1.0000 | ORAL_TABLET | Freq: Every day | ORAL | 11 refills | Status: DC

## 2018-12-03 NOTE — Progress Notes (Signed)
    GYNECOLOGY OFFICE PROCEDURE NOTE  Morgan Schwartz is a 33 y.o. [phone] here for Mirena IUD removal. No GYN concerns.  Last pap smear was on 08-01-2018 and was NILM with positive HPV.  IUD Removal  Patient identified, informed consent performed, consent signed.  Patient was in the dorsal lithotomy position, normal external genitalia was noted.  A speculum was placed in the patient's vagina, normal discharge was noted, no lesions. The cervix was visualized, no lesions, no abnormal discharge.  The strings of the IUD were grasped and pulled using ring forceps. The IUD was removed in its entirety.  Patient tolerated the procedure well.    Patient will use OCP's for contraception.  Routine preventative health maintenance measures emphasized.   [name], MD, [place] [facility] 12/03/2018 12:25 PM

## 2018-12-03 NOTE — Progress Notes (Signed)
GYN presents for IUD Removal because it is affecting her skin, it was inserted 09/09/2015.   Patient wants to switch to OCP.  Last PAP 08/01/2018

## 2018-12-04 LAB — CBC
Hematocrit: 40.9 % (ref 34.0–46.6)
Hemoglobin: 13.2 g/dL (ref 11.1–15.9)
MCH: 27.4 pg (ref 26.6–33.0)
MCHC: 32.3 g/dL (ref 31.5–35.7)
MCV: 85 fL (ref 79–97)
Platelets: 207 10*3/uL (ref 150–450)
RBC: 4.82 x10E6/uL (ref 3.77–5.28)
RDW: 13 % (ref 11.7–15.4)
WBC: 8.6 10*3/uL (ref 3.4–10.8)

## 2018-12-04 LAB — COMPREHENSIVE METABOLIC PANEL
ALT: 9 IU/L (ref 0–32)
AST: 13 IU/L (ref 0–40)
Albumin/Globulin Ratio: 1.7 (ref 1.2–2.2)
Albumin: 4.4 g/dL (ref 3.8–4.8)
Alkaline Phosphatase: 59 IU/L (ref 39–117)
BUN/Creatinine Ratio: 13 (ref 9–23)
BUN: 13 mg/dL (ref 6–20)
Bilirubin Total: 0.4 mg/dL (ref 0.0–1.2)
CO2: 23 mmol/L (ref 20–29)
Calcium: 9.4 mg/dL (ref 8.7–10.2)
Chloride: 104 mmol/L (ref 96–106)
Creatinine, Ser: 0.99 mg/dL (ref 0.57–1.00)
GFR calc Af Amer: 88 mL/min/{1.73_m2} (ref >59)
GFR calc non Af Amer: 76 mL/min/{1.73_m2} (ref >59)
Globulin, Total: 2.6 g/dL (ref 1.5–4.5)
Glucose: 78 mg/dL (ref 65–99)
Potassium: 4.4 mmol/L (ref 3.5–5.2)
Sodium: 141 mmol/L (ref 134–144)
Total Protein: 7 g/dL (ref 6.0–8.5)

## 2018-12-04 LAB — TSH: TSH: 1.76 u[IU]/mL (ref 0.450–4.500)

## 2018-12-04 LAB — HEPATITIS B SURFACE ANTIGEN: Hepatitis B Surface Ag: NEGATIVE

## 2018-12-04 LAB — RPR: RPR Ser Ql: NONREACTIVE

## 2018-12-04 LAB — HIV ANTIBODY (ROUTINE TESTING W REFLEX): HIV Screen 4th Generation wRfx: NONREACTIVE

## 2018-12-04 LAB — HEPATITIS C ANTIBODY: Hep C Virus Ab: <0.1 s/co ratio (ref 0.0–0.9)
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
## 2019-03-03 ENCOUNTER — Ambulatory Visit: Payer: 59

## 2019-03-03 DIAGNOSIS — Z3480 Encounter for supervision of other normal pregnancy, unspecified trimester: Secondary | ICD-10-CM | POA: Insufficient documentation

## 2019-03-03 MED ORDER — BLOOD PRESSURE KIT DEVI: 1.0000 | 0 refills | Status: AC | PRN

## 2019-03-03 NOTE — Progress Notes (Signed)
Patient seen and assessed by nursing staff during this encounter. I have reviewed the chart and agree with the documentation and plan.  Catalina Antigua, MD 03/03/2019 3:32 PM

## 2019-03-03 NOTE — Progress Notes (Signed)
..    Virtual Visit via Telephone Note  I connected with Morgan Schwartz on 03/03/19 at  2:15 PM EST by telephone and verified that I am speaking with the correct person using two identifiers.  Location: Patient: Management consultant   I discussed the limitations, risks, security and privacy concerns of performing an evaluation and management service by telephone and the availability of in person appointments. I also discussed with the patient that there may be a patient responsible charge related to this service. The patient expressed understanding and agreed to proceed.   History of Present Illness: PRENATAL INTAKE SUMMARY  Morgan Schwartz presents today New OB Nurse Interview.  OB History    Gravida  7   Para  3   Term  3   Preterm      AB  3   Living  3     SAB      TAB  3   Ectopic      Multiple      Live Births  3          I have reviewed the patient's medical, obstetrical, social, and family histories, medications, and available lab results.  SUBJECTIVE She has no unusual complaints   Observations/Objective: Initial nurse interview for history/labs (New OB)  EDD: 09-30-19 GA: [redacted]w[redacted]d GP: G7P3  GENERAL APPEARANCE: alert, well appearing  Assessment and Plan: Normal pregnancy; [facility]. Advised pt that I would send rx for BP cuff, but unsure of costs with private insurance. Advised pt that she has option to purchase a cheaper one if too expensive, pt agreed.  Follow Up Instructions:   I discussed the assessment and treatment plan with the patient. The patient was provided an opportunity to ask questions and all were answered. The patient agreed with the plan and demonstrated an understanding of the instructions.   The patient was advised to call back or seek an in-person evaluation if the symptoms worsen or if the condition fails to improve as anticipated.  I provided 15 minutes of non-face-to-face time during this encounter.   Katrina Stack, RN

## 2019-03-10 ENCOUNTER — Ambulatory Visit (INDEPENDENT_AMBULATORY_CARE_PROVIDER_SITE_OTHER): Payer: 59 | Admitting: Obstetrics and Gynecology

## 2019-03-10 ENCOUNTER — Encounter: Payer: Self-pay | Admitting: Obstetrics and Gynecology

## 2019-03-10 ENCOUNTER — Other Ambulatory Visit: Payer: Self-pay

## 2019-03-10 VITALS — BP 111/76 | HR 76 | Wt 176.0 lb

## 2019-03-10 DIAGNOSIS — Z124 Encounter for screening for malignant neoplasm of cervix: Secondary | ICD-10-CM | POA: Diagnosis not present

## 2019-03-10 DIAGNOSIS — Z3481 Encounter for supervision of other normal pregnancy, first trimester: Secondary | ICD-10-CM

## 2019-03-10 DIAGNOSIS — Z113 Encounter for screening for infections with a predominantly sexual mode of transmission: Secondary | ICD-10-CM

## 2019-03-10 DIAGNOSIS — N898 Other specified noninflammatory disorders of vagina: Secondary | ICD-10-CM | POA: Diagnosis not present

## 2019-03-10 DIAGNOSIS — Z1151 Encounter for screening for human papillomavirus (HPV): Secondary | ICD-10-CM

## 2019-03-10 DIAGNOSIS — B9689 Other specified bacterial agents as the cause of diseases classified elsewhere: Secondary | ICD-10-CM | POA: Diagnosis not present

## 2019-03-10 DIAGNOSIS — N760 Acute vaginitis: Secondary | ICD-10-CM

## 2019-03-10 DIAGNOSIS — Z3480 Encounter for supervision of other normal pregnancy, unspecified trimester: Secondary | ICD-10-CM

## 2019-03-10 MED ORDER — PROMETHAZINE HCL 25 MG PO TABS: 25.0000 mg | ORAL_TABLET | Freq: Four times a day (QID) | ORAL | 2 refills | Status: AC | PRN

## 2019-03-10 MED ORDER — DOXYLAMINE-PYRIDOXINE 10-10 MG PO TBEC: 2.0000 | DELAYED_RELEASE_TABLET | Freq: Every day | ORAL | 5 refills | Status: AC

## 2019-03-10 NOTE — Progress Notes (Signed)
   Subjective:    Morgan Schwartz is a [id number] [redacted]w[redacted]d being seen today for her first obstetrical visit.  Her obstetrical history is significant for 3 previous uncomplicated pregnancies. Patient does intend to breast feed. Pregnancy history fully reviewed.  Patient reports nausea.  Vitals:   03/10/19 1421  BP: 111/76  Pulse: 76  Weight: 176 lb (79.8 kg)    HISTORY: OB History  Gravida Para Term Preterm AB Living  7 3 3   3 3   SAB TAB Ectopic Multiple Live Births    3     3    # Outcome Date GA Lbr Len/2nd Weight Sex Delivery Anes PTL Lv  7 Current           6 Term 06/11/12 [redacted]w[redacted]d 02:02 / 00:11 7 lb 7 oz (3.374 kg) F Vag-Spont EPI  LIV  5 TAB 2013 [redacted]w[redacted]d         4 Term 2011 [redacted]w[redacted]d 08:00 7 lb 12 oz (3.515 kg) F Vag-Spont None  LIV  3 Term 2008 [redacted]w[redacted]d 12:00 6 lb 10 oz (3.005 kg) M Vag-Spont EPI  LIV  2 TAB 2007          1 TAB            Past Medical History:  Diagnosis Date  . Abnormal Pap smear   . Anemia   . Late prenatal care   . Obesity   . Vaginal Pap smear, abnormal    Past Surgical History:  Procedure Laterality Date  . NO PAST SURGERIES     Family History  Problem Relation Age of Onset  . Cancer Paternal Grandfather   . Alcohol abuse Neg Hx      Exam    Uterus:   11-12 weeks  Pelvic Exam:    Perineum: No Hemorrhoids, Normal Perineum   Vulva: normal   Vagina:  normal mucosa, normal discharge   pH:    Cervix: multiparous appearance and cervix is closed and long   Adnexa: normal adnexa and no mass, fullness, tenderness   Bony Pelvis: gynecoid  System: Breast:  normal appearance, no masses or tenderness   Skin: normal coloration and turgor, no rashes    Neurologic: oriented, no focal deficits   Extremities: normal strength, tone, and muscle mass   HEENT extra ocular movement intact   Mouth/Teeth mucous membranes moist, pharynx normal without lesions and dental hygiene good   Neck supple and no masses   Cardiovascular: regular rate and rhythm   Respiratory:   appears well, vitals normal, no respiratory distress, acyanotic, normal RR, chest clear, no wheezing, crepitations, rhonchi, normal symmetric air entry   Abdomen: soft, non-tender; bowel sounds normal; no masses,  no organomegaly   Urinary:       Assessment:    Pregnancy: 2008 Patient Active Problem List   Diagnosis Date Noted  . Supervision of other normal pregnancy, antepartum 03/03/2019  . Acne vulgaris 07/30/2018  . Nucleic acid assay by PCR positive for Chlamydia 10/07/2015        Plan:     Initial labs drawn. Prenatal vitamins. Problem list reviewed and updated. Genetic Screening discussed : panorama ordered.  Ultrasound discussed; fetal survey: dating ultrasound ordered.  Follow up in 4 weeks in person for AFP Rx diclegis and phenergan provided 50% of 30 min visit spent on counseling and coordination of care.     Bruk Tumolo 03/10/2019

## 2019-03-10 NOTE — Patient Instructions (Signed)
 First Trimester of Pregnancy The first trimester of pregnancy is from week 1 until the end of week 13 (months 1 through 3). A week after a sperm fertilizes an egg, the egg will implant on the wall of the uterus. This embryo will begin to develop into a baby. Genes from you and your partner will form the baby. The female genes will determine whether the baby will be a boy or a girl. At 6-8 weeks, the eyes and face will be formed, and the heartbeat can be seen on ultrasound. At the end of 12 weeks, all the baby's organs will be formed. Now that you are pregnant, you will want to do everything you can to have a healthy baby. Two of the most important things are to get good prenatal care and to follow your health care provider's instructions. Prenatal care is all the medical care you receive before the baby's birth. This care will help prevent, find, and treat any problems during the pregnancy and childbirth. Body changes during your first trimester Your body goes through many changes during pregnancy. The changes vary from woman to woman.  You may gain or lose a couple of pounds at first.  You may feel sick to your stomach (nauseous) and you may throw up (vomit). If the vomiting is uncontrollable, call your health care provider.  You may tire easily.  You may develop headaches that can be relieved by medicines. All medicines should be approved by your health care provider.  You may urinate more often. Painful urination may mean you have a bladder infection.  You may develop heartburn as a result of your pregnancy.  You may develop constipation because certain hormones are causing the muscles that push stool through your intestines to slow down.  You may develop hemorrhoids or swollen veins (varicose veins).  Your breasts may begin to grow larger and become tender. Your nipples may stick out more, and the tissue that surrounds them (areola) may become darker.  Your gums may bleed and may be  sensitive to brushing and flossing.  Dark spots or blotches (chloasma, mask of pregnancy) may develop on your face. This will likely fade after the baby is born.  Your menstrual periods will stop.  You may have a loss of appetite.  You may develop cravings for certain kinds of food.  You may have changes in your emotions from day to day, such as being excited to be pregnant or being concerned that something may go wrong with the pregnancy and baby.  You may have more vivid and strange dreams.  You may have changes in your hair. These can include thickening of your hair, rapid growth, and changes in texture. Some women also have hair loss during or after pregnancy, or hair that feels dry or thin. Your hair will most likely return to normal after your baby is born. What to expect at prenatal visits During a routine prenatal visit:  You will be weighed to make sure you and the baby are growing normally.  Your blood pressure will be taken.  Your abdomen will be measured to track your baby's growth.  The fetal heartbeat will be listened to between weeks 10 and 14 of your pregnancy.  Test results from any previous visits will be discussed. Your health care provider may ask you:  How you are feeling.  If you are feeling the baby move.  If you have had any abnormal symptoms, such as leaking fluid, bleeding, severe headaches, or   abdominal cramping.  If you are using any tobacco products, including cigarettes, chewing tobacco, and electronic cigarettes.  If you have any questions. Other tests that may be performed during your first trimester include:  Blood tests to find your blood type and to check for the presence of any previous infections. The tests will also be used to check for low iron levels (anemia) and protein on red blood cells (Rh antibodies). Depending on your risk factors, or if you previously had diabetes during pregnancy, you may have tests to check for high blood sugar  that affects pregnant women (gestational diabetes).  Urine tests to check for infections, diabetes, or protein in the urine.  An ultrasound to confirm the proper growth and development of the baby.  Fetal screens for spinal cord problems (spina bifida) and Down syndrome.  HIV (human immunodeficiency virus) testing. Routine prenatal testing includes screening for HIV, unless you choose not to have this test.  You may need other tests to make sure you and the baby are doing well. Follow these instructions at home: Medicines  Follow your health care provider's instructions regarding medicine use. Specific medicines may be either safe or unsafe to take during pregnancy.  Take a prenatal vitamin that contains at least 600 micrograms (mcg) of folic acid.  If you develop constipation, try taking a stool softener if your health care provider approves. Eating and drinking   Eat a balanced diet that includes fresh fruits and vegetables, whole grains, good sources of protein such as meat, eggs, or tofu, and low-fat dairy. Your health care provider will help you determine the amount of weight gain that is right for you.  Avoid raw meat and uncooked cheese. These carry germs that can cause birth defects in the baby.  Eating four or five small meals rather than three large meals a day may help relieve nausea and vomiting. If you start to feel nauseous, eating a few soda crackers can be helpful. Drinking liquids between meals, instead of during meals, also seems to help ease nausea and vomiting.  Limit foods that are high in fat and processed sugars, such as fried and sweet foods.  To prevent constipation: ? Eat foods that are high in fiber, such as fresh fruits and vegetables, whole grains, and beans. ? Drink enough fluid to keep your urine clear or pale yellow. Activity  Exercise only as directed by your health care provider. Most women can continue their usual exercise routine during  pregnancy. Try to exercise for 30 minutes at least 5 days a week. Exercising will help you: ? Control your weight. ? Stay in shape. ? Be prepared for labor and delivery.  Experiencing pain or cramping in the lower abdomen or lower back is a good sign that you should stop exercising. Check with your health care provider before continuing with normal exercises.  Try to avoid standing for long periods of time. Move your legs often if you must stand in one place for a long time.  Avoid heavy lifting.  Wear low-heeled shoes and practice good posture.  You may continue to have sex unless your health care provider tells you not to. Relieving pain and discomfort  Wear a good support bra to relieve breast tenderness.  Take warm sitz baths to soothe any pain or discomfort caused by hemorrhoids. Use hemorrhoid cream if your health care provider approves.  Rest with your legs elevated if you have leg cramps or low back pain.  If you develop varicose veins   in your legs, wear support hose. Elevate your feet for 15 minutes, 3-4 times a day. Limit salt in your diet. Prenatal care  Schedule your prenatal visits by the twelfth week of pregnancy. They are usually scheduled monthly at first, then more often in the last 2 months before delivery.  Write down your questions. Take them to your prenatal visits.  Keep all your prenatal visits as told by your health care provider. This is important. Safety  Wear your seat belt at all times when driving.  Make a list of emergency phone numbers, including numbers for family, friends, the hospital, and police and fire departments. General instructions  Ask your health care provider for a referral to a local prenatal education class. Begin classes no later than the beginning of month 6 of your pregnancy.  Ask for help if you have counseling or nutritional needs during pregnancy. Your health care provider can offer advice or refer you to specialists for help  with various needs.  Do not use hot tubs, steam rooms, or saunas.  Do not douche or use tampons or scented sanitary pads.  Do not cross your legs for long periods of time.  Avoid cat litter boxes and soil used by cats. These carry germs that can cause birth defects in the baby and possibly loss of the fetus by miscarriage or stillbirth.  Avoid all smoking, herbs, alcohol, and medicines not prescribed by your health care provider. Chemicals in these products affect the formation and growth of the baby.  Do not use any products that contain nicotine or tobacco, such as cigarettes and e-cigarettes. If you need help quitting, ask your health care provider. You may receive counseling support and other resources to help you quit.  Schedule a dentist appointment. At home, brush your teeth with a soft toothbrush and be gentle when you floss. Contact a health care provider if:  You have dizziness.  You have mild pelvic cramps, pelvic pressure, or nagging pain in the abdominal area.  You have persistent nausea, vomiting, or diarrhea.  You have a bad smelling vaginal discharge.  You have pain when you urinate.  You notice increased swelling in your face, hands, legs, or ankles.  You are exposed to fifth disease or chickenpox.  You are exposed to German measles (rubella) and have never had it. Get help right away if:  You have a fever.  You are leaking fluid from your vagina.  You have spotting or bleeding from your vagina.  You have severe abdominal cramping or pain.  You have rapid weight gain or loss.  You vomit blood or material that looks like coffee grounds.  You develop a severe headache.  You have shortness of breath.  You have any kind of trauma, such as from a fall or a car accident. Summary  The first trimester of pregnancy is from week 1 until the end of week 13 (months 1 through 3).  Your body goes through many changes during pregnancy. The changes vary from  woman to woman.  You will have routine prenatal visits. During those visits, your health care provider will examine you, discuss any test results you may have, and talk with you about how you are feeling. This information is not intended to replace advice given to you by your health care provider. Make sure you discuss any questions you have with your health care provider. Document Revised: 12/15/2016 Document Reviewed: 12/15/2015 [employer] Patient Education  2020 [employer]   Second Trimester of   Pregnancy The second trimester is from week 14 through week 27 (months 4 through 6). The second trimester is often a time when you feel your best. Your body has adjusted to being pregnant, and you begin to feel better physically. Usually, morning sickness has lessened or quit completely, you may have more energy, and you may have an increase in appetite. The second trimester is also a time when the fetus is growing rapidly. At the end of the sixth month, the fetus is about 9 inches long and weighs about 1 pounds. You will likely begin to feel the baby move (quickening) between 16 and 20 weeks of pregnancy. Body changes during your second trimester Your body continues to go through many changes during your second trimester. The changes vary from woman to woman.  Your weight will continue to increase. You will notice your lower abdomen bulging out.  You may begin to get stretch marks on your hips, abdomen, and breasts.  You may develop headaches that can be relieved by medicines. The medicines should be approved by your health care provider.  You may urinate more often because the fetus is pressing on your bladder.  You may develop or continue to have heartburn as a result of your pregnancy.  You may develop constipation because certain hormones are causing the muscles that push waste through your intestines to slow down.  You may develop hemorrhoids or swollen, bulging veins (varicose  veins).  You may have back pain. This is caused by: ? Weight gain. ? Pregnancy hormones that are relaxing the joints in your pelvis. ? A shift in weight and the muscles that support your balance.  Your breasts will continue to grow and they will continue to become tender.  Your gums may bleed and may be sensitive to brushing and flossing.  Dark spots or blotches (chloasma, mask of pregnancy) may develop on your face. This will likely fade after the baby is born.  A dark line from your belly button to the pubic area (linea nigra) may appear. This will likely fade after the baby is born.  You may have changes in your hair. These can include thickening of your hair, rapid growth, and changes in texture. Some women also have hair loss during or after pregnancy, or hair that feels dry or thin. Your hair will most likely return to normal after your baby is born. What to expect at prenatal visits During a routine prenatal visit:  You will be weighed to make sure you and the fetus are growing normally.  Your blood pressure will be taken.  Your abdomen will be measured to track your baby's growth.  The fetal heartbeat will be listened to.  Any test results from the previous visit will be discussed. Your health care provider may ask you:  How you are feeling.  If you are feeling the baby move.  If you have had any abnormal symptoms, such as leaking fluid, bleeding, severe headaches, or abdominal cramping.  If you are using any tobacco products, including cigarettes, chewing tobacco, and electronic cigarettes.  If you have any questions. Other tests that may be performed during your second trimester include:  Blood tests that check for: ? Low iron levels (anemia). ? High blood sugar that affects pregnant women (gestational diabetes) between 24 and 28 weeks. ? Rh antibodies. This is to check for a protein on red blood cells (Rh factor).  Urine tests to check for infections,  diabetes, or protein in the urine.    An ultrasound to confirm the proper growth and development of the baby.  An amniocentesis to check for possible genetic problems.  Fetal screens for spina bifida and Down syndrome.  HIV (human immunodeficiency virus) testing. Routine prenatal testing includes screening for HIV, unless you choose not to have this test. Follow these instructions at home: Medicines  Follow your health care provider's instructions regarding medicine use. Specific medicines may be either safe or unsafe to take during pregnancy.  Take a prenatal vitamin that contains at least 600 micrograms (mcg) of folic acid.  If you develop constipation, try taking a stool softener if your health care provider approves. Eating and drinking   Eat a balanced diet that includes fresh fruits and vegetables, whole grains, good sources of protein such as meat, eggs, or tofu, and low-fat dairy. Your health care provider will help you determine the amount of weight gain that is right for you.  Avoid raw meat and uncooked cheese. These carry germs that can cause birth defects in the baby.  If you have low calcium intake from food, talk to your health care provider about whether you should take a daily calcium supplement.  Limit foods that are high in fat and processed sugars, such as fried and sweet foods.  To prevent constipation: ? Drink enough fluid to keep your urine clear or pale yellow. ? Eat foods that are high in fiber, such as fresh fruits and vegetables, whole grains, and beans. Activity  Exercise only as directed by your health care provider. Most women can continue their usual exercise routine during pregnancy. Try to exercise for 30 minutes at least 5 days a week. Stop exercising if you experience uterine contractions.  Avoid heavy lifting, wear low heel shoes, and practice good posture.  A sexual relationship may be continued unless your health care provider directs you  otherwise. Relieving pain and discomfort  Wear a good support bra to prevent discomfort from breast tenderness.  Take warm sitz baths to soothe any pain or discomfort caused by hemorrhoids. Use hemorrhoid cream if your health care provider approves.  Rest with your legs elevated if you have leg cramps or low back pain.  If you develop varicose veins, wear support hose. Elevate your feet for 15 minutes, 3-4 times a day. Limit salt in your diet. Prenatal Care  Write down your questions. Take them to your prenatal visits.  Keep all your prenatal visits as told by your health care provider. This is important. Safety  Wear your seat belt at all times when driving.  Make a list of emergency phone numbers, including numbers for family, friends, the hospital, and police and fire departments. General instructions  Ask your health care provider for a referral to a local prenatal education class. Begin classes no later than the beginning of month 6 of your pregnancy.  Ask for help if you have counseling or nutritional needs during pregnancy. Your health care provider can offer advice or refer you to specialists for help with various needs.  Do not use hot tubs, steam rooms, or saunas.  Do not douche or use tampons or scented sanitary pads.  Do not cross your legs for long periods of time.  Avoid cat litter boxes and soil used by cats. These carry germs that can cause birth defects in the baby and possibly loss of the fetus by miscarriage or stillbirth.  Avoid all smoking, herbs, alcohol, and unprescribed drugs. Chemicals in these products can affect the formation and growth of   the baby.  Do not use any products that contain nicotine or tobacco, such as cigarettes and e-cigarettes. If you need help quitting, ask your health care provider.  Visit your dentist if you have not gone yet during your pregnancy. Use a soft toothbrush to brush your teeth and be gentle when you floss. Contact a  health care provider if:  You have dizziness.  You have mild pelvic cramps, pelvic pressure, or nagging pain in the abdominal area.  You have persistent nausea, vomiting, or diarrhea.  You have a bad smelling vaginal discharge.  You have pain when you urinate. Get help right away if:  You have a fever.  You are leaking fluid from your vagina.  You have spotting or bleeding from your vagina.  You have severe abdominal cramping or pain.  You have rapid weight gain or weight loss.  You have shortness of breath with chest pain.  You notice sudden or extreme swelling of your face, hands, ankles, feet, or legs.  You have not felt your baby move in over an hour.  You have severe headaches that do not go away when you take medicine.  You have vision changes. Summary  The second trimester is from week 14 through week 27 (months 4 through 6). It is also a time when the fetus is growing rapidly.  Your body goes through many changes during pregnancy. The changes vary from woman to woman.  Avoid all smoking, herbs, alcohol, and unprescribed drugs. These chemicals affect the formation and growth your baby.  Do not use any tobacco products, such as cigarettes, chewing tobacco, and e-cigarettes. If you need help quitting, ask your health care provider.  Contact your health care provider if you have any questions. Keep all prenatal visits as told by your health care provider. This is important. This information is not intended to replace advice given to you by your health care provider. Make sure you discuss any questions you have with your health care provider. Document Revised: 04/26/2018 Document Reviewed: 02/08/2016 Else[employer] Patient Education  2020 [employer]   Contraception Choices Contraception, also called birth control, refers to methods or devices that prevent pregnancy. Hormonal methods Contraceptive implant  A contraceptive implant is a thin, plastic tube that  contains a hormone. It is inserted into the upper part of the arm. It can remain in place for up to 3 years. Progestin-only injections Progestin-only injections are injections of progestin, a synthetic form of the hormone progesterone. They are given every 3 months by a health care provider. Birth control pills  Birth control pills are pills that contain hormones that prevent pregnancy. They must be taken once a day, preferably at the same time each day. Birth control patch  The birth control patch contains hormones that prevent pregnancy. It is placed on the skin and must be changed once a week for three weeks and removed on the fourth week. A prescription is needed to use this method of contraception. Vaginal ring  A vaginal ring contains hormones that prevent pregnancy. It is placed in the vagina for three weeks and removed on the fourth week. After that, the process is repeated with a new ring. A prescription is needed to use this method of contraception. Emergency contraceptive Emergency contraceptives prevent pregnancy after unprotected sex. They come in pill form and can be taken up to 5 days after sex. They work best the sooner they are taken after having sex. Most emergency contraceptives are available without a prescription. This   method should not be used as your only form of birth control. Barrier methods Female condom  A female condom is a thin sheath that is worn over the penis during sex. Condoms keep sperm from going inside a woman's body. They can be used with a spermicide to increase their effectiveness. They should be disposed after a single use. Female condom  A female condom is a soft, loose-fitting sheath that is put into the vagina before sex. The condom keeps sperm from going inside a woman's body. They should be disposed after a single use. Diaphragm  A diaphragm is a soft, dome-shaped barrier. It is inserted into the vagina before sex, along with a spermicide. The  diaphragm blocks sperm from entering the uterus, and the spermicide kills sperm. A diaphragm should be left in the vagina for 6-8 hours after sex and removed within 24 hours. A diaphragm is prescribed and fitted by a health care provider. A diaphragm should be replaced every 1-2 years, after giving birth, after gaining more than 15 lb (6.8 kg), and after pelvic surgery. Cervical cap  A cervical cap is a round, soft latex or plastic cup that fits over the cervix. It is inserted into the vagina before sex, along with spermicide. It blocks sperm from entering the uterus. The cap should be left in place for 6-8 hours after sex and removed within 48 hours. A cervical cap must be prescribed and fitted by a health care provider. It should be replaced every 2 years. Sponge  A sponge is a soft, circular piece of polyurethane foam with spermicide on it. The sponge helps block sperm from entering the uterus, and the spermicide kills sperm. To use it, you make it wet and then insert it into the vagina. It should be inserted before sex, left in for at least 6 hours after sex, and removed and thrown away within 30 hours. Spermicides Spermicides are chemicals that kill or block sperm from entering the cervix and uterus. They can come as a cream, jelly, suppository, foam, or tablet. A spermicide should be inserted into the vagina with an applicator at least 10-15 minutes before sex to allow time for it to work. The process must be repeated every time you have sex. Spermicides do not require a prescription. Intrauterine contraception Intrauterine device (IUD) An IUD is a T-shaped device that is put in a woman's uterus. There are two types:  Hormone IUD.This type contains progestin, a synthetic form of the hormone progesterone. This type can stay in place for 3-5 years.  Copper IUD.This type is wrapped in copper wire. It can stay in place for 10 years.  Permanent methods of contraception Female tubal ligation In  this method, a woman's fallopian tubes are sealed, tied, or blocked during surgery to prevent eggs from traveling to the uterus. Hysteroscopic sterilization In this method, a small, flexible insert is placed into each fallopian tube. The inserts cause scar tissue to form in the fallopian tubes and block them, so sperm cannot reach an egg. The procedure takes about 3 months to be effective. Another form of birth control must be used during those 3 months. Female sterilization This is a procedure to tie off the tubes that carry sperm (vasectomy). After the procedure, the man can still ejaculate fluid (semen). Natural planning methods Natural family planning In this method, a couple does not have sex on days when the woman could become pregnant. Calendar method This means keeping track of the length of each menstrual cycle,   identifying the days when pregnancy can happen, and not having sex on those days. Ovulation method In this method, a couple avoids sex during ovulation. Symptothermal method This method involves not having sex during ovulation. The woman typically checks for ovulation by watching changes in her temperature and in the consistency of cervical mucus. Post-ovulation method In this method, a couple waits to have sex until after ovulation. Summary  Contraception, also called birth control, means methods or devices that prevent pregnancy.  Hormonal methods of contraception include implants, injections, pills, patches, vaginal rings, and emergency contraceptives.  Barrier methods of contraception can include female condoms, female condoms, diaphragms, cervical caps, sponges, and spermicides.  There are two types of IUDs (intrauterine devices). An IUD can be put in a woman's uterus to prevent pregnancy for 3-5 years.  Permanent sterilization can be done through a procedure for males, females, or both.  Natural family planning methods involve not having sex on days when the woman could  become pregnant. This information is not intended to replace advice given to you by your health care provider. Make sure you discuss any questions you have with your health care provider. Document Revised: 01/04/2017 Document Reviewed: 02/05/2016 Else[employer] Patient Education  2020 [employer]   Breastfeeding  Choosing to breastfeed is one of the best decisions you can make for yourself and your baby. A change in hormones during pregnancy causes your breasts to make breast milk in your milk-producing glands. Hormones prevent breast milk from being released before your baby is born. They also prompt milk flow after birth. Once breastfeeding has begun, thoughts of your baby, as well as his or her sucking or crying, can stimulate the release of milk from your milk-producing glands. Benefits of breastfeeding Research shows that breastfeeding offers many health benefits for infants and mothers. It also offers a cost-free and convenient way to feed your baby. For your baby  Your first milk (colostrum) helps your baby's digestive system to function better.  Special cells in your milk (antibodies) help your baby to fight off infections.  Breastfed babies are less likely to develop asthma, allergies, obesity, or type 2 diabetes. They are also at lower risk for sudden infant death syndrome (SIDS).  Nutrients in breast milk are better able to meet your baby's needs compared to infant formula.  Breast milk improves your baby's brain development. For you  Breastfeeding helps to create a very special bond between you and your baby.  Breastfeeding is convenient. Breast milk costs nothing and is always available at the correct temperature.  Breastfeeding helps to burn calories. It helps you to lose the weight that you gained during pregnancy.  Breastfeeding makes your uterus return faster to its size before pregnancy. It also slows bleeding (lochia) after you give birth.  Breastfeeding helps to lower  your risk of developing type 2 diabetes, osteoporosis, rheumatoid arthritis, cardiovascular disease, and breast, ovarian, uterine, and endometrial cancer later in life. Breastfeeding basics Starting breastfeeding  Find a comfortable place to sit or lie down, with your neck and back well-supported.  Place a pillow or a rolled-up blanket under your baby to bring him or her to the level of your breast (if you are seated). Nursing pillows are specially designed to help support your arms and your baby while you breastfeed.  Make sure that your baby's tummy (abdomen) is facing your abdomen.  Gently massage your breast. With your fingertips, massage from the outer edges of your breast inward toward the nipple. This encourages   milk flow. If your milk flows slowly, you may need to continue this action during the feeding.  Support your breast with 4 fingers underneath and your thumb above your nipple (make the letter "C" with your hand). Make sure your fingers are well away from your nipple and your baby's mouth.  Stroke your baby's lips gently with your finger or nipple.  When your baby's mouth is open wide enough, quickly bring your baby to your breast, placing your entire nipple and as much of the areola as possible into your baby's mouth. The areola is the colored area around your nipple. ? More areola should be visible above your baby's upper lip than below the lower lip. ? Your baby's lips should be opened and extended outward (flanged) to ensure an adequate, comfortable latch. ? Your baby's tongue should be between his or her lower gum and your breast.  Make sure that your baby's mouth is correctly positioned around your nipple (latched). Your baby's lips should create a seal on your breast and be turned out (everted).  It is common for your baby to suck about 2-3 minutes in order to start the flow of breast milk. Latching Teaching your baby how to latch onto your breast properly is very  important. An improper latch can cause nipple pain, decreased milk supply, and poor weight gain in your baby. Also, if your baby is not latched onto your nipple properly, he or she may swallow some air during feeding. This can make your baby fussy. Burping your baby when you switch breasts during the feeding can help to get rid of the air. However, teaching your baby to latch on properly is still the best way to prevent fussiness from swallowing air while breastfeeding. Signs that your baby has successfully latched onto your nipple  Silent tugging or silent sucking, without causing you pain. Infant's lips should be extended outward (flanged).  Swallowing heard between every 3-4 sucks once your milk has started to flow (after your let-down milk reflex occurs).  Muscle movement above and in front of his or her ears while sucking. Signs that your baby has not successfully latched onto your nipple  Sucking sounds or smacking sounds from your baby while breastfeeding.  Nipple pain. If you think your baby has not latched on correctly, slip your finger into the corner of your baby's mouth to break the suction and place it between your baby's gums. Attempt to start breastfeeding again. Signs of successful breastfeeding Signs from your baby  Your baby will gradually decrease the number of sucks or will completely stop sucking.  Your baby will fall asleep.  Your baby's body will relax.  Your baby will retain a small amount of milk in his or her mouth.  Your baby will let go of your breast by himself or herself. Signs from you  Breasts that have increased in firmness, weight, and size 1-3 hours after feeding.  Breasts that are softer immediately after breastfeeding.  Increased milk volume, as well as a change in milk consistency and color by the fifth day of breastfeeding.  Nipples that are not sore, cracked, or bleeding. Signs that your baby is getting enough milk  Wetting at least 1-2  diapers during the first 24 hours after birth.  Wetting at least 5-6 diapers every 24 hours for the first week after birth. The urine should be clear or pale yellow by the age of 5 days.  Wetting 6-8 diapers every 24 hours as your baby continues   to grow and develop.  At least 3 stools in a 24-hour period by the age of 5 days. The stool should be soft and yellow.  At least 3 stools in a 24-hour period by the age of 7 days. The stool should be seedy and yellow.  No loss of weight greater than 10% of birth weight during the first 3 days of life.  Average weight gain of 4-7 oz (113-198 g) per week after the age of 4 days.  Consistent daily weight gain by the age of 5 days, without weight loss after the age of 2 weeks. After a feeding, your baby may spit up a small amount of milk. This is normal. Breastfeeding frequency and duration Frequent feeding will help you make more milk and can prevent sore nipples and extremely full breasts (breast engorgement). Breastfeed when you feel the need to reduce the fullness of your breasts or when your baby shows signs of hunger. This is called "breastfeeding on demand." Signs that your baby is hungry include:  Increased alertness, activity, or restlessness.  Movement of the head from side to side.  Opening of the mouth when the corner of the mouth or cheek is stroked (rooting).  Increased sucking sounds, smacking lips, cooing, sighing, or squeaking.  Hand-to-mouth movements and sucking on fingers or hands.  Fussing or crying. Avoid introducing a pacifier to your baby in the first 4-6 weeks after your baby is born. After this time, you may choose to use a pacifier. Research has shown that pacifier use during the first year of a baby's life decreases the risk of sudden infant death syndrome (SIDS). Allow your baby to feed on each breast as long as he or she wants. When your baby unlatches or falls asleep while feeding from the first breast, offer the  second breast. Because newborns are often sleepy in the first few weeks of life, you may need to awaken your baby to get him or her to feed. Breastfeeding times will vary from baby to baby. However, the following rules can serve as a guide to help you make sure that your baby is properly fed:  Newborns (babies 4 weeks of age or younger) may breastfeed every 1-3 hours.  Newborns should not go without breastfeeding for longer than 3 hours during the day or 5 hours during the night.  You should breastfeed your baby a minimum of 8 times in a 24-hour period. Breast milk pumping     Pumping and storing breast milk allows you to make sure that your baby is exclusively fed your breast milk, even at times when you are unable to breastfeed. This is especially important if you go back to work while you are still breastfeeding, or if you are not able to be present during feedings. Your lactation consultant can help you find a method of pumping that works best for you and give you guidelines about how long it is safe to store breast milk. Caring for your breasts while you breastfeed Nipples can become dry, cracked, and sore while breastfeeding. The following recommendations can help keep your breasts moisturized and healthy:  Avoid using soap on your nipples.  Wear a supportive bra designed especially for nursing. Avoid wearing underwire-style bras or extremely tight bras (sports bras).  Air-dry your nipples for 3-4 minutes after each feeding.  Use only cotton bra pads to absorb leaked breast milk. Leaking of breast milk between feedings is normal.  Use lanolin on your nipples after breastfeeding. Lanolin helps   to maintain your skin's normal moisture barrier. Pure lanolin is not harmful (not toxic) to your baby. You may also hand express a few drops of breast milk and gently massage that milk into your nipples and allow the milk to air-dry. In the first few weeks after giving birth, some women experience  breast engorgement. Engorgement can make your breasts feel heavy, warm, and tender to the touch. Engorgement peaks within 3-5 days after you give birth. The following recommendations can help to ease engorgement:  Completely empty your breasts while breastfeeding or pumping. You may want to start by applying warm, moist heat (in the shower or with warm, water-soaked hand towels) just before feeding or pumping. This increases circulation and helps the milk flow. If your baby does not completely empty your breasts while breastfeeding, pump any extra milk after he or she is finished.  Apply ice packs to your breasts immediately after breastfeeding or pumping, unless this is too uncomfortable for you. To do this: ? Put ice in a plastic bag. ? Place a towel between your skin and the bag. ? Leave the ice on for 20 minutes, 2-3 times a day.  Make sure that your baby is latched on and positioned properly while breastfeeding. If engorgement persists after 48 hours of following these recommendations, contact your health care provider or a lactation consultant. Overall health care recommendations while breastfeeding  Eat 3 healthy meals and 3 snacks every day. Well-nourished mothers who are breastfeeding need an additional 450-500 calories a day. You can meet this requirement by increasing the amount of a balanced diet that you eat.  Drink enough water to keep your urine pale yellow or clear.  Rest often, relax, and continue to take your prenatal vitamins to prevent fatigue, stress, and low vitamin and mineral levels in your body (nutrient deficiencies).  Do not use any products that contain nicotine or tobacco, such as cigarettes and e-cigarettes. Your baby may be harmed by chemicals from cigarettes that pass into breast milk and exposure to secondhand smoke. If you need help quitting, ask your health care provider.  Avoid alcohol.  Do not use illegal drugs or marijuana.  Talk with your health care  provider before taking any medicines. These include over-the-counter and prescription medicines as well as vitamins and herbal supplements. Some medicines that may be harmful to your baby can pass through breast milk.  It is possible to become pregnant while breastfeeding. If birth control is desired, ask your health care provider about options that will be safe while breastfeeding your baby. Where to find more information: [employer]: www.llli.org Contact a health care provider if:  You feel like you want to stop breastfeeding or have become frustrated with breastfeeding.  Your nipples are cracked or bleeding.  Your breasts are red, tender, or warm.  You have: ? Painful breasts or nipples. ? A swollen area on either breast. ? A fever or chills. ? Nausea or vomiting. ? Drainage other than breast milk from your nipples.  Your breasts do not become full before feedings by the fifth day after you give birth.  You feel sad and depressed.  Your baby is: ? Too sleepy to eat well. ? Having trouble sleeping. ? More than 1 week old and wetting fewer than 6 diapers in a 24-hour period. ? Not gaining weight by 5 days of age.  Your baby has fewer than 3 stools in a 24-hour period.  Your baby's skin or the white parts of   his or her eyes become yellow. Get help right away if:  Your baby is overly tired (lethargic) and does not want to wake up and feed.  Your baby develops an unexplained fever. Summary  Breastfeeding offers many health benefits for infant and mothers.  Try to breastfeed your infant when he or she shows early signs of hunger.  Gently tickle or stroke your baby's lips with your finger or nipple to allow the baby to open his or her mouth. Bring the baby to your breast. Make sure that much of the areola is in your baby's mouth. Offer one side and burp the baby before you offer the other side.  Talk with your health care provider or lactation consultant  if you have questions or you face problems as you breastfeed. This information is not intended to replace advice given to you by your health care provider. Make sure you discuss any questions you have with your health care provider. Document Revised: 03/29/2017 Document Reviewed: 02/04/2016 Else[employer] Patient Education  2020 [employer]  

## 2019-03-11 LAB — OBSTETRIC PANEL, INCLUDING HIV
Antibody Screen: NEGATIVE
Basophils Absolute: 0 10*3/uL (ref 0.0–0.2)
Basos: 0 %
EOS (ABSOLUTE): 0 10*3/uL (ref 0.0–0.4)
Eos: 0 %
HIV Screen 4th Generation wRfx: NONREACTIVE
Hematocrit: 39.7 % (ref 34.0–46.6)
Hemoglobin: 13.3 g/dL (ref 11.1–15.9)
Hepatitis B Surface Ag: NEGATIVE
Immature Grans (Abs): 0 10*3/uL (ref 0.0–0.1)
Immature Granulocytes: 0 %
Lymphocytes Absolute: 2.4 10*3/uL (ref 0.7–3.1)
Lymphs: 22 %
MCH: 28.4 pg (ref 26.6–33.0)
MCHC: 33.5 g/dL (ref 31.5–35.7)
MCV: 85 fL (ref 79–97)
Monocytes Absolute: 0.8 10*3/uL (ref 0.1–0.9)
Monocytes: 7 %
Neutrophils Absolute: 7.6 10*3/uL — ABNORMAL HIGH (ref 1.4–7.0)
Neutrophils: 71 %
Platelets: 226 10*3/uL (ref 150–450)
RBC: 4.69 x10E6/uL (ref 3.77–5.28)
RDW: 12.8 % (ref 11.7–15.4)
RPR Ser Ql: NONREACTIVE
Rh Factor: POSITIVE
Rubella Antibodies, IGG: 2.29 index (ref >0.99)
WBC: 10.8 10*3/uL (ref 3.4–10.8)

## 2019-03-11 LAB — COMPREHENSIVE METABOLIC PANEL
ALT: 11 IU/L (ref 0–32)
AST: 14 IU/L (ref 0–40)
Albumin/Globulin Ratio: 1.5 (ref 1.2–2.2)
Albumin: 3.8 g/dL (ref 3.8–4.8)
Alkaline Phosphatase: 46 IU/L (ref 39–117)
BUN/Creatinine Ratio: 10 (ref 9–23)
BUN: 8 mg/dL (ref 6–20)
Bilirubin Total: 0.3 mg/dL (ref 0.0–1.2)
CO2: 23 mmol/L (ref 20–29)
Calcium: 8.8 mg/dL (ref 8.7–10.2)
Chloride: 101 mmol/L (ref 96–106)
Creatinine, Ser: 0.82 mg/dL (ref 0.57–1.00)
GFR calc Af Amer: 109 mL/min/{1.73_m2} (ref >59)
GFR calc non Af Amer: 95 mL/min/{1.73_m2} (ref >59)
Globulin, Total: 2.6 g/dL (ref 1.5–4.5)
Glucose: 60 mg/dL — ABNORMAL LOW (ref 65–99)
Potassium: 3.8 mmol/L (ref 3.5–5.2)
Sodium: 135 mmol/L (ref 134–144)
Total Protein: 6.4 g/dL (ref 6.0–8.5)

## 2019-03-11 LAB — HEMOGLOBIN A1C
Est. average glucose Bld gHb Est-mCnc: 103 mg/dL
Hgb A1c MFr Bld: 5.2 % (ref 4.8–5.6)

## 2019-03-12 LAB — CERVICOVAGINAL ANCILLARY ONLY
Bacterial Vaginitis (gardnerella): POSITIVE — AB
Candida Glabrata: NEGATIVE
Candida Vaginitis: NEGATIVE
Chlamydia: NEGATIVE
Comment: NEGATIVE
Comment: NEGATIVE
Comment: NEGATIVE
Comment: NEGATIVE
Comment: NEGATIVE
Comment: NORMAL
Neisseria Gonorrhea: NEGATIVE
Trichomonas: NEGATIVE

## 2019-03-12 LAB — URINE CULTURE

## 2019-03-12 MED ORDER — METRONIDAZOLE 500 MG PO TABS: 500.0000 mg | ORAL_TABLET | Freq: Two times a day (BID) | ORAL | 0 refills | Status: AC

## 2019-03-12 MED ORDER — CEPHALEXIN 500 MG PO CAPS: 500.0000 mg | ORAL_CAPSULE | Freq: Four times a day (QID) | ORAL | 2 refills | Status: AC

## 2019-03-12 NOTE — Addendum Note (Signed)
Addended by: Catalina Antigua on: 03/12/2019 04:28 PM   Modules accepted: Orders

## 2019-03-14 LAB — CYTOLOGY - PAP
Comment: NEGATIVE
Diagnosis: NEGATIVE
High risk HPV: NEGATIVE

## 2019-03-18 ENCOUNTER — Other Ambulatory Visit: Payer: Self-pay

## 2019-03-18 ENCOUNTER — Ambulatory Visit (HOSPITAL_COMMUNITY)
Admission: RE | Admit: 2019-03-18 | Discharge: 2019-03-18 | Disposition: A | Discharge status: Home / Self Care | Payer: 59 | Source: Ambulatory Visit | Attending: Obstetrics and Gynecology | Admitting: Obstetrics and Gynecology

## 2019-03-18 DIAGNOSIS — Z3480 Encounter for supervision of other normal pregnancy, unspecified trimester: Secondary | ICD-10-CM | POA: Diagnosis not present

## 2019-03-19 ENCOUNTER — Encounter: Payer: Self-pay | Admitting: Obstetrics and Gynecology

## 2019-03-27 ENCOUNTER — Encounter: Payer: Self-pay | Admitting: Obstetrics and Gynecology

## 2019-04-14 ENCOUNTER — Encounter: Payer: 59 | Admitting: Advanced Practice Midwife

## 2019-04-16 ENCOUNTER — Encounter: Payer: Self-pay | Admitting: Obstetrics
